# Patient Record
Sex: Female | Born: 1980 | Race: Black or African American | Hispanic: No | Marital: Single | Smoking: Former smoker
Health system: Southern US, Community
[De-identification: ages and names within clinical notes are randomized; demographics above are authoritative.]

## PROBLEM LIST (undated history)

## (undated) DIAGNOSIS — E669 Obesity, unspecified: Secondary | ICD-10-CM

## (undated) DIAGNOSIS — F99 Mental disorder, not otherwise specified: Secondary | ICD-10-CM

## (undated) DIAGNOSIS — N63 Unspecified lump in unspecified breast: Secondary | ICD-10-CM

## (undated) DIAGNOSIS — Z309 Encounter for contraceptive management, unspecified: Secondary | ICD-10-CM

## (undated) DIAGNOSIS — N390 Urinary tract infection, site not specified: Secondary | ICD-10-CM

## (undated) DIAGNOSIS — R87629 Unspecified abnormal cytological findings in specimens from vagina: Secondary | ICD-10-CM

## (undated) HISTORY — DX: Mental disorder, not otherwise specified: F99

## (undated) HISTORY — DX: Unspecified lump in unspecified breast: N63.0

## (undated) HISTORY — DX: Encounter for contraceptive management, unspecified: Z30.9

## (undated) HISTORY — DX: Urinary tract infection, site not specified: N39.0

## (undated) HISTORY — DX: Unspecified abnormal cytological findings in specimens from vagina: R87.629

## (undated) HISTORY — PX: CERVICAL BIOPSY: SHX590

## (undated) HISTORY — DX: Obesity, unspecified: E66.9

---
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
## 2002-12-28 ENCOUNTER — Other Ambulatory Visit: Admission: RE | Admit: 2002-12-28 | Discharge: 2002-12-28 | Payer: Self-pay | Admitting: Obstetrics and Gynecology

## 2004-04-26 ENCOUNTER — Other Ambulatory Visit: Admission: RE | Admit: 2004-04-26 | Discharge: 2004-04-26 | Payer: Self-pay | Admitting: Obstetrics and Gynecology

## 2008-09-23 ENCOUNTER — Other Ambulatory Visit: Admission: RE | Admit: 2008-09-23 | Discharge: 2008-09-23 | Payer: Self-pay | Admitting: Obstetrics and Gynecology

## 2009-04-17 ENCOUNTER — Ambulatory Visit (HOSPITAL_COMMUNITY): Admission: RE | Admit: 2009-04-17 | Discharge: 2009-04-17 | Payer: Self-pay | Admitting: Internal Medicine

## 2011-08-07 ENCOUNTER — Other Ambulatory Visit (HOSPITAL_COMMUNITY)
Admission: RE | Admit: 2011-08-07 | Discharge: 2011-08-07 | Disposition: A | Discharge status: Home / Self Care | Payer: Self-pay | Source: Ambulatory Visit | Attending: Obstetrics and Gynecology | Admitting: Obstetrics and Gynecology

## 2011-08-07 ENCOUNTER — Other Ambulatory Visit: Payer: Self-pay | Admitting: Adult Health

## 2011-08-07 DIAGNOSIS — Z01419 Encounter for gynecological examination (general) (routine) without abnormal findings: Secondary | ICD-10-CM | POA: Insufficient documentation

## 2011-08-07 DIAGNOSIS — Z113 Encounter for screening for infections with a predominantly sexual mode of transmission: Secondary | ICD-10-CM | POA: Insufficient documentation

## 2012-07-29 ENCOUNTER — Telehealth: Payer: Self-pay | Admitting: Adult Health

## 2012-07-29 ENCOUNTER — Telehealth: Payer: Self-pay | Admitting: *Deleted

## 2012-07-29 NOTE — Telephone Encounter (Signed)
Spoke with pt, she needs refills on her BC and Antidepressants. She has an appointment in May at our office. I advised the pt to call her pharmacy and get them to fax over a refill request. She verbalized understanding and stated she would do that.

## 2012-07-29 NOTE — Telephone Encounter (Signed)
I believe this was sent to me in error.  De Burrs Description: 32 year old female  07/29/2012 Telephone Provider: Adline Potter, NP  MRN: [id number] Department: [facility]            Reason for Call    Medication Management    Pt wants to talk to someone about birthcontrol and an anti-depressant           Call Documentation    No notes of this type exist for this encounter.         Encounter MyChart Messages    No messages in this encounter         Routing History    Priority Sent On From To Message Type    07/29/2012 2:50 PM Susie Cassette Barrett Tarri Fuller, CMA Patient Calls      Created by    Girard Cooter on 07/29/2012 02:49 PM

## 2012-07-30 ENCOUNTER — Telehealth: Payer: Self-pay | Admitting: Adult Health

## 2012-07-30 NOTE — Telephone Encounter (Signed)
Called patient, she just needs refills on sprintec and celexa. This was already taken care of by fax, she has appointment in May for physical.

## 2012-09-14 ENCOUNTER — Other Ambulatory Visit: Payer: Self-pay | Admitting: Adult Health

## 2012-10-15 ENCOUNTER — Encounter: Payer: Self-pay | Admitting: *Deleted

## 2012-10-16 ENCOUNTER — Ambulatory Visit (INDEPENDENT_AMBULATORY_CARE_PROVIDER_SITE_OTHER): Payer: BC Managed Care – PPO | Admitting: Adult Health

## 2012-10-16 ENCOUNTER — Other Ambulatory Visit (HOSPITAL_COMMUNITY)
Admission: RE | Admit: 2012-10-16 | Discharge: 2012-10-16 | Disposition: A | Discharge status: Home / Self Care | Payer: BC Managed Care – PPO | Source: Ambulatory Visit | Attending: Adult Health | Admitting: Adult Health

## 2012-10-16 ENCOUNTER — Encounter: Payer: Self-pay | Admitting: Adult Health

## 2012-10-16 VITALS — BP 116/80 | HR 76 | Ht 70.0 in | Wt 264.0 lb

## 2012-10-16 DIAGNOSIS — Z01419 Encounter for gynecological examination (general) (routine) without abnormal findings: Secondary | ICD-10-CM | POA: Insufficient documentation

## 2012-10-16 DIAGNOSIS — R4589 Other symptoms and signs involving emotional state: Secondary | ICD-10-CM

## 2012-10-16 DIAGNOSIS — Z309 Encounter for contraceptive management, unspecified: Secondary | ICD-10-CM

## 2012-10-16 DIAGNOSIS — F329 Major depressive disorder, single episode, unspecified: Secondary | ICD-10-CM | POA: Insufficient documentation

## 2012-10-16 DIAGNOSIS — Z113 Encounter for screening for infections with a predominantly sexual mode of transmission: Secondary | ICD-10-CM

## 2012-10-16 NOTE — Patient Instructions (Addendum)
Follow-up in 1 year for physical

## 2012-10-16 NOTE — Progress Notes (Signed)
Patient ID: Sue Nguyen, female   DOB: 1980/07/29, 32 y.o.   MRN: [id number] History of Present Illness: Sue Nguyen is a 32 year old black female in for a pap and physical. Desires STD testing.Happy with OCs and celexa.  Current Medications, Allergies, Past Medical History, Past Surgical History, Family History and Social History were reviewed in [employer] record.    Review of Systems: Patient denies any headaches, blurred vision, shortness of breath, chest pain, abdominal pain, problems with bowel movements, urination, or intercourse. No joint pains and she great with celexa, much less moody.  Physical Exam:BP 116/80  Pulse 76  Ht 5\' 10"  (1.778 m)  Wt 264 lb (119.75 kg)  BMI 37.88 kg/m2  LMP 09/21/2012 General:  Well developed, well nourished, no acute distress Skin:  Warm and dry Neck:  Midline trachea, normal thyroid Lungs; Clear to auscultation bilaterally Breast:  No dominant palpable mass, retraction, or nipple discharge Cardiovascular: Regular rate and rhythm Abdomen:  Soft, non tender, no hepatosplenomegaly Pelvic:  External genitalia is normal in appearance.  The vagina is normal in appearance.  The cervix is smooth with negative CMT.Pap performed with GC/CHL.  Uterus is felt to be normal size, shape, and contour.  No adnexal masses or tenderness noted. Extremities:  No swelling or varicosities noted Psych: alert and cooperative, seems to be in good mood, happy  Impression: Yearly gyn exam Contraceptive management History of moodiness STD screening  Plan: Physical in 1 year Check, CBC,CMP,TSH, lipids and HIV,RPR,HSV2, and Hept. B&C, can call next week or look on my chart for results She already has refills on celexa and sprintec

## 2012-10-17 LAB — COMPREHENSIVE METABOLIC PANEL
ALT: 15 U/L (ref 0–35)
AST: 21 U/L (ref 0–37)
Alkaline Phosphatase: 43 U/L (ref 39–117)
Chloride: 105 mEq/L (ref 96–112)
Creat: 0.83 mg/dL (ref 0.50–1.10)
Total Bilirubin: 0.4 mg/dL (ref 0.3–1.2)

## 2012-10-17 LAB — LIPID PANEL
HDL: 42 mg/dL (ref >39)
LDL Cholesterol: 41 mg/dL (ref 0–99)
Total CHOL/HDL Ratio: 2.6 Ratio
VLDL: 27 mg/dL (ref 0–40)

## 2012-10-17 LAB — CBC
MCH: 23.6 pg — ABNORMAL LOW (ref 26.0–34.0)
MCV: 74.6 fL — ABNORMAL LOW (ref 78.0–100.0)
Platelets: 261 10*3/uL (ref 150–400)
RDW: 15.3 % (ref 11.5–15.5)
WBC: 8.1 10*3/uL (ref 4.0–10.5)

## 2012-10-17 LAB — HIV ANTIBODY (ROUTINE TESTING W REFLEX): HIV: NONREACTIVE

## 2012-10-17 LAB — RPR

## 2012-10-19 LAB — HSV 2 ANTIBODY, IGG: HSV 2 Glycoprotein G Ab, IgG: <0.1 IV

## 2012-10-21 ENCOUNTER — Telehealth: Payer: Self-pay | Admitting: Adult Health

## 2012-10-21 NOTE — Telephone Encounter (Signed)
Pt informed of HGB of 10.8, encouraged to eat foods high in iron, iron supplement all other labs WNL per Cyril Mourning, NP

## 2013-03-18 ENCOUNTER — Other Ambulatory Visit: Payer: Self-pay

## 2013-04-12 HISTORY — PX: CHOLECYSTECTOMY: SHX55

## 2013-08-21 ENCOUNTER — Other Ambulatory Visit: Payer: Self-pay | Admitting: Adult Health

## 2013-08-23 ENCOUNTER — Telehealth: Payer: Self-pay | Admitting: *Deleted

## 2013-08-24 NOTE — Telephone Encounter (Signed)
Pt states refills done.

## 2013-09-25 ENCOUNTER — Other Ambulatory Visit: Payer: Self-pay | Admitting: Adult Health

## 2013-10-19 ENCOUNTER — Other Ambulatory Visit (HOSPITAL_COMMUNITY)
Admission: RE | Admit: 2013-10-19 | Discharge: 2013-10-19 | Disposition: A | Discharge status: Home / Self Care | Payer: BC Managed Care – PPO | Source: Ambulatory Visit | Attending: Adult Health | Admitting: Adult Health

## 2013-10-19 ENCOUNTER — Encounter: Payer: Self-pay | Admitting: Adult Health

## 2013-10-19 ENCOUNTER — Ambulatory Visit (INDEPENDENT_AMBULATORY_CARE_PROVIDER_SITE_OTHER): Payer: BC Managed Care – PPO | Admitting: Adult Health

## 2013-10-19 VITALS — BP 104/54 | HR 76 | Ht 70.0 in | Wt 280.0 lb

## 2013-10-19 DIAGNOSIS — Z1151 Encounter for screening for human papillomavirus (HPV): Secondary | ICD-10-CM | POA: Insufficient documentation

## 2013-10-19 DIAGNOSIS — Z01419 Encounter for gynecological examination (general) (routine) without abnormal findings: Secondary | ICD-10-CM

## 2013-10-19 DIAGNOSIS — N63 Unspecified lump in unspecified breast: Secondary | ICD-10-CM

## 2013-10-19 DIAGNOSIS — Z113 Encounter for screening for infections with a predominantly sexual mode of transmission: Secondary | ICD-10-CM

## 2013-10-19 DIAGNOSIS — Z309 Encounter for contraceptive management, unspecified: Secondary | ICD-10-CM

## 2013-10-19 DIAGNOSIS — R4589 Other symptoms and signs involving emotional state: Secondary | ICD-10-CM

## 2013-10-19 HISTORY — DX: Encounter for contraceptive management, unspecified: Z30.9

## 2013-10-19 HISTORY — DX: Unspecified lump in unspecified breast: N63.0

## 2013-10-19 MED ORDER — CITALOPRAM HYDROBROMIDE 20 MG PO TABS: ORAL_TABLET | ORAL | Status: DC

## 2013-10-19 MED ORDER — NORGESTIMATE-ETH ESTRADIOL 0.25-35 MG-MCG PO TABS: ORAL_TABLET | ORAL | Status: DC

## 2013-10-19 NOTE — Progress Notes (Signed)
Patient ID: Sue Nguyen, female   DOB: [birth year], 33 y.o.   MRN: [id number] History of Present Illness: Sue Nguyen is a 33 year old black female in for a pap and physical.And she wants STD testing.She is happy with celexa and sprintec.She had noticed a nodule in left breast.   Current Medications, Allergies, Past Medical History, Past Surgical History, Family History and Social History were reviewed in [employer] record.     Review of Systems: Patient denies any headaches, blurred vision, shortness of breath, chest pain, abdominal pain, problems with bowel movements, urination, or intercourse. No joint pain or mood swings.    Physical Exam:BP 104/54  Pulse 76  Ht 5\' 10"  (1.778 m)  Wt 280 lb (127.007 kg)  BMI 40.18 kg/m2  LMP 10/13/2013 General:  Well developed, well nourished, no acute distress Skin:  Warm and dry Neck:  Midline trachea, normal thyroid Lungs; Clear to auscultation bilaterally Breast:  No dominant palpable mass, retraction, or nipple discharge on right, on left no retraction or nipple discharge but has a pea sized nodule at 2 0;clock 6 fr from areola in crease between breast and underarm at stretch mark. Cardiovascular: Regular rate and rhythm Abdomen:  Soft, non tender, no hepatosplenomegaly Pelvic:  External genitalia is normal in appearance.  The vagina is normal in appearance.   The cervix is smooth, pap with HPV and GC/CHL performed.  Uterus is felt to be normal size, shape, and contour.  No    adnexal masses or tenderness noted. Extremities:  No swelling or varicosities noted Psych:  No mood changes, alert and cooperative,seems happy Discussed that breast nodule could be cyst or may be fibroglandular changes, but will get mammogram and [place] to be sure.  Impression: Yearly gyn exam  Left breast nodule Sue Nguyen Contraceptive management STD screening    Plan: Physical in 1 year Left diagnostic mammogram and left breast US 6/24 at 8:45 am at  [facility] Check HIV,RPR,HSV 2 Refilled sprintec ax 1 year Refilled celexa x 1 year Review handout on breast cyst

## 2013-10-19 NOTE — Patient Instructions (Signed)
Breast Cyst A breast cyst is a sac in the breast that is filled with fluid. Breast cysts are common in women. Women can have one or many cysts. When the breasts contain many cysts, it is usually due to a noncancerous (benign) condition called fibrocystic change. These lumps form under the influence of female hormones (estrogen and progesterone). The lumps are most often located in the upper, outer portion of the breast. They are often more swollen, painful, and tender before your period starts. They usually disappear after menopause, unless you are on hormone therapy.  There are several types of cysts:  Macrocyst. This is a cyst that is about 2 in. (5.1 cm) in diameter.   Microcyst. This is a tiny cyst that you cannot feel but can be seen with a mammogram or an ultrasound.   Galactocele. This is a cyst containing milk that may develop if you suddenly stop breastfeeding.   Sebaceous cyst of the skin. This type of cyst is not in the breast tissue itself. Breast cysts do not increase your risk of breast cancer. However, they must be monitored closely because they can be cancerous.  CAUSES  It is not known exactly what causes a breast cyst to form. Possible causes include:  An overgrowth of milk glands and connective tissue in the breast can block the milk glands, causing them to fill with fluid.   Scar tissue in the breast from previous surgery may block the glands, causing a cyst.  RISK FACTORS Estrogen may influence the development of a breast cyst.  SIGNS AND SYMPTOMS   Feeling a smooth, round, soft lump (like a grape) in the breast that is easily moveable.   Breast discomfort or pain.  Increase in size of the lump before your menstrual period and decrease in its size after your menstrual period.  DIAGNOSIS  A cyst can be felt during a physical exam by your health care provider. A breast X-ray exam (mammogram) and ultrasonography will be done to confirm the diagnosis. Fluid may  be removed from the cyst with a needle (fine needle aspiration) to make sure the cyst is not cancerous.  TREATMENT  Treatment may not be necessary. Your health care provider may monitor the cyst to see if it goes away on its own. If treatment is needed, it may include:  Hormone treatment.   Needle aspiration. There is a chance of the cyst coming back after aspiration.   Surgery to remove the whole cyst.  HOME CARE INSTRUCTIONS   Keep all follow-up appointments with your health care provider.  See your health care provider regularly:  Get a yearly exam by your health care provider.  Have a clinical breast exam by a health care provider every 1 3 years if you are 40 31 years of age. After age 60 years, you should have the exam every year.   Get mammogram tests as directed by your health care provider.   Understand the normal appearance and feel of your breasts and perform breast self-exams.   Only take over-the-counter or prescription medicines as directed by your health care provider.   Wear a supportive bra, especially when exercising.   Avoid caffeine.   Reduce your salt intake, especially before your menstrual period. Too much salt can cause fluid retention, breast swelling, and discomfort.  SEEK MEDICAL CARE IF:   You feel, or think you feel, a lump in your breast.   You notice that both breasts look or feel different than usual.  Your breast is still causing pain after your menstrual period is over.   You need medicine for breast pain and swelling that occurs with your menstrual period.  SEEK IMMEDIATE MEDICAL CARE IF:   You have severe pain, tenderness, redness, or warmth in your breast.   You have nipple discharge or bleeding.   Your breast lump becomes hard and painful.   You find new lumps or bumps that were not there before.   You feel lumps in your armpit (axilla).   You notice dimpling or wrinkling of the breast or nipple.   You  have a fever.  MAKE SURE YOU:  Understand these instructions.  Will watch your condition.  Will get help right away if you are not doing well or get worse. Document Released: 04/29/2005 Document Revised: 12/30/2012 Document Reviewed: 11/26/2012 [facility] Patient Information 2014 [place], [place]. Follow up by phone or my chart Physical in 1 year

## 2013-10-20 ENCOUNTER — Telehealth: Payer: Self-pay | Admitting: Adult Health

## 2013-10-20 LAB — HIV ANTIBODY (ROUTINE TESTING W REFLEX): HIV: NONREACTIVE

## 2013-10-20 LAB — RPR

## 2013-10-20 LAB — HSV 2 ANTIBODY, IGG: HSV 2 Glycoprotein G Ab, IgG: <0.1 IV

## 2013-10-20 NOTE — Telephone Encounter (Signed)
Pt aware labs negative.  

## 2013-10-21 LAB — CYTOLOGY - PAP

## 2013-11-03 ENCOUNTER — Encounter (HOSPITAL_COMMUNITY): Payer: BC Managed Care – PPO

## 2013-11-10 ENCOUNTER — Encounter (HOSPITAL_COMMUNITY): Payer: BC Managed Care – PPO

## 2013-11-16 ENCOUNTER — Ambulatory Visit (HOSPITAL_COMMUNITY)
Admission: RE | Admit: 2013-11-16 | Discharge: 2013-11-16 | Disposition: A | Discharge status: Home / Self Care | Payer: BC Managed Care – PPO | Source: Ambulatory Visit | Attending: Adult Health | Admitting: Adult Health

## 2013-11-16 DIAGNOSIS — N63 Unspecified lump in unspecified breast: Secondary | ICD-10-CM

## 2013-11-16 DIAGNOSIS — R928 Other abnormal and inconclusive findings on diagnostic imaging of breast: Secondary | ICD-10-CM | POA: Insufficient documentation

## 2014-03-14 ENCOUNTER — Encounter: Payer: Self-pay | Admitting: Adult Health

## 2014-07-04 ENCOUNTER — Telehealth: Payer: Self-pay | Admitting: Family Medicine

## 2014-07-04 NOTE — Telephone Encounter (Signed)
PCP IS [place].  SHE IS GOING THERE.

## 2014-07-05 ENCOUNTER — Ambulatory Visit (INDEPENDENT_AMBULATORY_CARE_PROVIDER_SITE_OTHER): Payer: BLUE CROSS/BLUE SHIELD | Admitting: Adult Health

## 2014-07-05 ENCOUNTER — Encounter: Payer: Self-pay | Admitting: Adult Health

## 2014-07-05 VITALS — BP 114/70 | HR 89 | Temp 98.2°F | Ht 69.5 in | Wt 269.5 lb

## 2014-07-05 DIAGNOSIS — R350 Frequency of micturition: Secondary | ICD-10-CM | POA: Insufficient documentation

## 2014-07-05 DIAGNOSIS — N390 Urinary tract infection, site not specified: Secondary | ICD-10-CM

## 2014-07-05 HISTORY — DX: Urinary tract infection, site not specified: N39.0

## 2014-07-05 LAB — POCT URINALYSIS DIPSTICK
GLUCOSE UA: NEGATIVE
Nitrite, UA: NEGATIVE
Protein, UA: NEGATIVE

## 2014-07-05 MED ORDER — SULFAMETHOXAZOLE-TRIMETHOPRIM 800-160 MG PO TABS: 1.0000 | ORAL_TABLET | Freq: Two times a day (BID) | ORAL | Status: DC

## 2014-07-05 NOTE — Progress Notes (Signed)
Subjective:     Patient ID: Sue Nguyen, female   DOB: [birth year], 34 y.o.   MRN: [id number]  HPI Sanah is a 34 year old black female in complaining of urinary frequency and pressure x 1 week,just got back from cruise.Had more sex and alcohol than usual.Has history of UTIs per pt.  Review of Systems +urinary frequency and pressure,some nausea, al other systems negative  Reviewed past medical,surgical, social and family history. Reviewed medications and allergies.     Objective:   Physical Exam BP 114/70 mmHg  Pulse 89  Temp(Src) 98.2 F (36.8 C)  Ht 5' 9.5" (1.765 m)  Wt 269 lb 8 oz (122.244 kg)  BMI 39.24 kg/m2  LMP 01/17/2016Urine 3+ blood and 2+ leuks, No CVAT. Skin warm and dry.Pelvic: external genitalia is normal in appearance no lesions, vagina: no discharge, no odor, good color and mositure,urethra has no lesions or masses noted, cervix:smooth, uterus: normal size, shape and contour, non tender, no masses felt, adnexa: no masses or tenderness noted. Bladder is non tender and no masses felt.    Assessment:     Urinary frequency with pressure UTI    Plan:    UA C&S sent Rx septra ds 1 bid x 7 days #14 Push fluids Review handout on UTI

## 2014-07-05 NOTE — Patient Instructions (Signed)
Urinary Tract Infection Urinary tract infections (UTIs) can develop anywhere along your urinary tract. Your urinary tract is your body's drainage system for removing wastes and extra water. Your urinary tract includes two kidneys, two ureters, a bladder, and a urethra. Your kidneys are a pair of bean-shaped organs. Each kidney is about the size of your fist. They are located below your ribs, one on each side of your spine. CAUSES Infections are caused by microbes, which are microscopic organisms, including fungi, viruses, and bacteria. These organisms are so small that they can only be seen through a microscope. Bacteria are the microbes that most commonly cause UTIs. SYMPTOMS  Symptoms of UTIs may vary by age and gender of the patient and by the location of the infection. Symptoms in young women typically include a frequent and intense urge to urinate and a painful, burning feeling in the bladder or urethra during urination. Older women and men are more likely to be tired, shaky, and weak and have muscle aches and abdominal pain. A fever may mean the infection is in your kidneys. Other symptoms of a kidney infection include pain in your back or sides below the ribs, nausea, and vomiting. DIAGNOSIS To diagnose a UTI, your caregiver will ask you about your symptoms. Your caregiver also will ask to provide a urine sample. The urine sample will be tested for bacteria and white blood cells. White blood cells are made by your body to help fight infection. TREATMENT  Typically, UTIs can be treated with medication. Because most UTIs are caused by a bacterial infection, they usually can be treated with the use of antibiotics. The choice of antibiotic and length of treatment depend on your symptoms and the type of bacteria causing your infection. HOME CARE INSTRUCTIONS  If you were prescribed antibiotics, take them exactly as your caregiver instructs you. Finish the medication even if you feel better after you  have only taken some of the medication.  Drink enough water and fluids to keep your urine clear or pale yellow.  Avoid caffeine, tea, and carbonated beverages. They tend to irritate your bladder.  Empty your bladder often. Avoid holding urine for long periods of time.  Empty your bladder before and after sexual intercourse.  After a bowel movement, women should cleanse from front to back. Use each tissue only once. SEEK MEDICAL CARE IF:   You have back pain.  You develop a fever.  Your symptoms do not begin to resolve within 3 days. SEEK IMMEDIATE MEDICAL CARE IF:   You have severe back pain or lower abdominal pain.  You develop chills.  You have nausea or vomiting.  You have continued burning or discomfort with urination. MAKE SURE YOU:   Understand these instructions.  Will watch your condition.  Will get help right away if you are not doing well or get worse. Document Released: 02/06/2005 Document Revised: 10/29/2011 Document Reviewed: 06/07/2011 [facility] Patient Information 2015 [place], [place]. This information is not intended to replace advice given to you by your health care provider. Make sure you discuss any questions you have with your health care provider. Push fluids Take septra ds

## 2014-07-06 LAB — URINALYSIS, ROUTINE W REFLEX MICROSCOPIC
BILIRUBIN UA: NEGATIVE
Glucose, UA: NEGATIVE
Ketones, UA: NEGATIVE
Nitrite, UA: NEGATIVE
PH UA: 6 (ref 5.0–7.5)
SPEC GRAV UA: 1.025 (ref 1.005–1.030)
Urobilinogen, Ur: 0.2 mg/dL (ref 0.2–1.0)

## 2014-07-06 LAB — MICROSCOPIC EXAMINATION: Casts: NONE SEEN /lpf

## 2014-07-07 ENCOUNTER — Telehealth: Payer: Self-pay | Admitting: Adult Health

## 2014-07-07 LAB — URINE CULTURE

## 2014-07-07 NOTE — Telephone Encounter (Signed)
Left message that did have UTI and that the septra ds should take care of it.

## 2014-10-19 ENCOUNTER — Telehealth: Payer: Self-pay | Admitting: Adult Health

## 2014-10-19 NOTE — Telephone Encounter (Signed)
Spoke with pt letting her know she can continue her Celexa per Knute Neu, CNM. Pt voiced understanding. [place]

## 2014-10-28 ENCOUNTER — Other Ambulatory Visit: Payer: Self-pay | Admitting: Obstetrics & Gynecology

## 2014-10-28 DIAGNOSIS — O3680X1 Pregnancy with inconclusive fetal viability, fetus 1: Secondary | ICD-10-CM

## 2014-10-31 ENCOUNTER — Ambulatory Visit (INDEPENDENT_AMBULATORY_CARE_PROVIDER_SITE_OTHER): Payer: Medicaid Other

## 2014-10-31 ENCOUNTER — Other Ambulatory Visit: Payer: BLUE CROSS/BLUE SHIELD

## 2014-10-31 DIAGNOSIS — O3680X1 Pregnancy with inconclusive fetal viability, fetus 1: Secondary | ICD-10-CM

## 2014-10-31 NOTE — Progress Notes (Signed)
[place] 9+3wks single IUP pos fht 171bpm,crl 24.6cm,rt ov 2.7 x 1.7 x 2.4cm simple corpus luteal cyst,normal lt ov

## 2014-11-01 ENCOUNTER — Other Ambulatory Visit: Payer: Self-pay | Admitting: Adult Health

## 2014-11-10 ENCOUNTER — Encounter: Payer: Self-pay | Admitting: Advanced Practice Midwife

## 2014-11-17 ENCOUNTER — Encounter: Payer: Self-pay | Admitting: Advanced Practice Midwife

## 2014-11-17 ENCOUNTER — Ambulatory Visit (INDEPENDENT_AMBULATORY_CARE_PROVIDER_SITE_OTHER): Payer: Medicaid Other | Admitting: Advanced Practice Midwife

## 2014-11-17 VITALS — BP 118/72 | HR 84 | Ht 73.0 in | Wt 273.5 lb

## 2014-11-17 DIAGNOSIS — Z1389 Encounter for screening for other disorder: Secondary | ICD-10-CM

## 2014-11-17 DIAGNOSIS — Z3490 Encounter for supervision of normal pregnancy, unspecified, unspecified trimester: Secondary | ICD-10-CM | POA: Insufficient documentation

## 2014-11-17 DIAGNOSIS — C700 Malignant neoplasm of cerebral meninges: Secondary | ICD-10-CM

## 2014-11-17 DIAGNOSIS — Z0283 Encounter for blood-alcohol and blood-drug test: Secondary | ICD-10-CM

## 2014-11-17 DIAGNOSIS — Z331 Pregnant state, incidental: Secondary | ICD-10-CM

## 2014-11-17 DIAGNOSIS — Z369 Encounter for antenatal screening, unspecified: Secondary | ICD-10-CM

## 2014-11-17 DIAGNOSIS — Z3491 Encounter for supervision of normal pregnancy, unspecified, first trimester: Secondary | ICD-10-CM

## 2014-11-17 LAB — POCT URINALYSIS DIPSTICK
Blood, UA: 3
GLUCOSE UA: NEGATIVE
Ketones, UA: NEGATIVE
Leukocytes, UA: NEGATIVE
NITRITE UA: NEGATIVE
Protein, UA: NEGATIVE

## 2014-11-17 MED ORDER — NITROFURANTOIN MONOHYD MACRO 100 MG PO CAPS: 100.0000 mg | ORAL_CAPSULE | Freq: Two times a day (BID) | ORAL | Status: AC

## 2014-11-17 NOTE — Progress Notes (Signed)
  Subjective:    Sue Nguyen is a G2P0010 [redacted]w[redacted]d being seen today for her first obstetrical visit.  Her obstetrical history is significant for obesity.  She has been walking 3.5 miles a day and has started eating clean, so she has lost weight. She also quit smoking the day she found out she was pregnant..  Pregnancy history fully reviewed.  Patient reports feeling as if she has a UTI:  Frequency, dysuria at end of voiding.    Filed Vitals:   11/17/14 1353 11/17/14 1418  BP: 118/72   Pulse: 84   Height:  6\' 1"  (1.854 m)  Weight: 273 lb 8 oz (124.059 kg)     HISTORY: OB History  Gravida Para Term Preterm AB SAB TAB Ectopic Multiple Living  2    1  1        # Outcome Date GA Lbr Len/2nd Weight Sex Delivery Anes PTL Lv  2 Current           1 TAB              Past Medical History  Diagnosis Date  . Mental disorder     moody  . Obesity   . Breast nodule 10/19/2013    Will get [place]  . Contraceptive management 10/19/2013  . Vaginal Pap smear, abnormal   . UTI (lower urinary tract infection) 07/05/2014   Past Surgical History  Procedure Laterality Date  . Cervical biopsy    . Cholecystectomy  04/2013   Family History  Problem Relation Age of Onset  . Alcohol abuse Other     mom's side of family  . Cancer Maternal Grandfather     lung  . Alcohol abuse Maternal Grandfather   . Alcohol abuse Maternal Grandmother     recovering alcoholic     Exam                                      System:     Skin: normal coloration and turgor, no rashes    Neurologic: oriented, normal, normal mood   Extremities: normal strength, tone, and muscle mass   HEENT PERRLA   Mouth/Teeth mucous membranes moist, normal dentition   Neck supple and no masses   Cardiovascular: regular rate and rhythm   Respiratory:  appears well, vitals normal, no respiratory distress, acyanotic   Abdomen: soft, non-tender;  FHR: 150 [place]          Assessment:    Pregnancy: G2P0010 Patient Active  Problem List   Diagnosis Date Noted  . Supervision of normal pregnancy 11/17/2014  . Breast nodule 10/19/2013  . Moody 10/16/2012        Plan:     Initial labs drawn. Continue prenatal vitamins;  May add Oxly Treat for UTI: rx Macrobid 100mg  BID X7 Problem list reviewed and updated  Reviewed recommended weight gain based on pre-gravid BMI  Encouraged well-balanced diet Genetic Screening discussed Integrated Screen: declined.  Ultrasound discussed; fetal survey: requested.  Follow up in 4 weeks for LROB.  CRESENZO-DISHMAN,Lira Stephen 11/17/2014

## 2014-11-17 NOTE — Patient Instructions (Signed)
Safe Medications in Pregnancy  ° °Acne: °Benzoyl Peroxide °Salicylic Acid ° °Backache/Headache: °Tylenol: 2 regular strength every 4 hours OR °             2 Extra strength every 6 hours ° °Colds/Coughs/Allergies: °Benadryl (alcohol free) 25 mg every 6 hours as needed °Breath right strips °Claritin °Cepacol throat lozenges °Chloraseptic throat spray °Cold-Eeze- up to three times per day °Cough drops, alcohol free °Flonase (by prescription only) °Guaifenesin °Mucinex °Robitussin DM (plain only, alcohol free) °Saline nasal spray/drops °Sudafed (pseudoephedrine) & Actifed ** use only after [redacted] weeks gestation and if you do not have high blood pressure °Tylenol °Vicks Vaporub °Zinc lozenges °Zyrtec  ° °Constipation: °Colace °Ducolax suppositories °Fleet enema °Glycerin suppositories °Metamucil °Milk of magnesia °Miralax °Senokot °Smooth move tea ° °Diarrhea: °Kaopectate °Imodium A-D ° °*NO pepto Bismol ° °Hemorrhoids: °Anusol °Anusol HC °Preparation H °Tucks ° °Indigestion: °Tums °Maalox °Mylanta °Zantac  °Pepcid ° °Insomnia: °Benadryl (alcohol free) 25mg every 6 hours as needed °Tylenol PM °Unisom, no Gelcaps ° °Leg Cramps: °Tums °MagGel ° °Nausea/Vomiting:  °Bonine °Dramamine °Emetrol °Ginger extract °Sea bands °Meclizine  °Nausea medication to take during pregnancy:  °Unisom (doxylamine succinate 25 mg tablets) Take one tablet daily at bedtime. If symptoms are not adequately controlled, the dose can be increased to a maximum recommended dose of two tablets daily (1/2 tablet in the morning, 1/2 tablet mid-afternoon and one at bedtime). °Vitamin B6 100mg tablets. Take one tablet twice a day (up to 200 mg per day). ° °Skin Rashes: °Aveeno products °Benadryl cream or 25mg every 6 hours as needed °Calamine Lotion °1% cortisone cream ° °Yeast infection: °Gyne-lotrimin 7 °Monistat 7 ° ° °**If taking multiple medications, please check labels to avoid duplicating the same active ingredients °**take medication as directed on  the label °** Do not exceed 4000 mg of tylenol in 24 hours °**Do not take medications that contain aspirin or ibuprofen ° ° ° °First Trimester of Pregnancy °The first trimester of pregnancy is from week 1 until the end of week 12 (months 1 through 3). A week after a sperm fertilizes an egg, the egg will implant on the wall of the uterus. This embryo will begin to develop into a baby. Genes from you and your partner are forming the baby. The female genes determine whether the baby is a boy or a girl. At 6-8 weeks, the eyes and face are formed, and the heartbeat can be seen on ultrasound. At the end of 12 weeks, all the baby's organs are formed.  °Now that you are pregnant, you will want to do everything you can to have a healthy baby. Two of the most important things are to get good prenatal care and to follow your health care provider's instructions. Prenatal care is all the medical care you receive before the baby's birth. This care will help prevent, find, and treat any problems during the pregnancy and childbirth. °BODY CHANGES °Your body goes through many changes during pregnancy. The changes vary from woman to woman.  °· You may gain or lose a couple of pounds at first. °· You may feel sick to your stomach (nauseous) and throw up (vomit). If the vomiting is uncontrollable, call your health care provider. °· You may tire easily. °· You may develop headaches that can be relieved by medicines approved by your health care provider. °· You may urinate more often. Painful urination may mean you have a bladder infection. °· You may develop heartburn as a result of your   pregnancy. °· You may develop constipation because certain hormones are causing the muscles that push waste through your intestines to slow down. °· You may develop hemorrhoids or swollen, bulging veins (varicose veins). °· Your breasts may begin to grow larger and become tender. Your nipples may stick out more, and the tissue that surrounds them (areola)  may become darker. °· Your gums may bleed and may be sensitive to brushing and flossing. °· Dark spots or blotches (chloasma, mask of pregnancy) may develop on your face. This will likely fade after the baby is born. °· Your menstrual periods will stop. °· You may have a loss of appetite. °· You may develop cravings for certain kinds of food. °· You may have changes in your emotions from day to day, such as being excited to be pregnant or being concerned that something may go wrong with the pregnancy and baby. °· You may have more vivid and strange dreams. °· You may have changes in your hair. These can include thickening of your hair, rapid growth, and changes in texture. Some women also have hair loss during or after pregnancy, or hair that feels dry or thin. Your hair will most likely return to normal after your baby is born. °WHAT TO EXPECT AT YOUR PRENATAL VISITS °During a routine prenatal visit: °· You will be weighed to make sure you and the baby are growing normally. °· Your blood pressure will be taken. °· Your abdomen will be measured to track your baby's growth. °· The fetal heartbeat will be listened to starting around week 10 or 12 of your pregnancy. °· Test results from any previous visits will be discussed. °Your health care provider may ask you: °· How you are feeling. °· If you are feeling the baby move. °· If you have had any abnormal symptoms, such as leaking fluid, bleeding, severe headaches, or abdominal cramping. °· If you have any questions. °Other tests that may be performed during your first trimester include: °· Blood tests to find your blood type and to check for the presence of any previous infections. They will also be used to check for low iron levels (anemia) and Rh antibodies. Later in the pregnancy, blood tests for diabetes will be done along with other tests if problems develop. °· Urine tests to check for infections, diabetes, or protein in the urine. °· An ultrasound to confirm  the proper growth and development of the baby. °· An amniocentesis to check for possible genetic problems. °· Fetal screens for spina bifida and Down syndrome. °· You may need other tests to make sure you and the baby are doing well. °HOME CARE INSTRUCTIONS  °Medicines °· Follow your health care provider's instructions regarding medicine use. Specific medicines may be either safe or unsafe to take during pregnancy. °· Take your prenatal vitamins as directed. °· If you develop constipation, try taking a stool softener if your health care provider approves. °Diet °· Eat regular, well-balanced meals. Choose a variety of foods, such as meat or vegetable-based protein, fish, milk and low-fat dairy products, vegetables, fruits, and whole grain breads and cereals. Your health care provider will help you determine the amount of weight gain that is right for you. °· Avoid raw meat and uncooked cheese. These carry germs that can cause birth defects in the baby. °· Eating four or five small meals rather than three large meals a day may help relieve nausea and vomiting. If you start to feel nauseous, eating a few soda crackers   can be helpful. Drinking liquids between meals instead of during meals also seems to help nausea and vomiting. °· If you develop constipation, eat more high-fiber foods, such as fresh vegetables or fruit and whole grains. Drink enough fluids to keep your urine clear or pale yellow. °Activity and Exercise °· Exercise only as directed by your health care provider. Exercising will help you: °¨ Control your weight. °¨ Stay in shape. °¨ Be prepared for labor and delivery. °· Experiencing pain or cramping in the lower abdomen or low back is a good sign that you should stop exercising. Check with your health care provider before continuing normal exercises. °· Try to avoid standing for long periods of time. Move your legs often if you must stand in one place for a long time. °· Avoid heavy lifting. °· Wear  low-heeled shoes, and practice good posture. °· You may continue to have sex unless your health care provider directs you otherwise. °Relief of Pain or Discomfort °· Wear a good support bra for breast tenderness.   °· Take warm sitz baths to soothe any pain or discomfort caused by hemorrhoids. Use hemorrhoid cream if your health care provider approves.   °· Rest with your legs elevated if you have leg cramps or low back pain. °· If you develop varicose veins in your legs, wear support hose. Elevate your feet for 15 minutes, 3-4 times a day. Limit salt in your diet. °Prenatal Care °· Schedule your prenatal visits by the twelfth week of pregnancy. They are usually scheduled monthly at first, then more often in the last 2 months before delivery. °· Write down your questions. Take them to your prenatal visits. °· Keep all your prenatal visits as directed by your health care provider. °Safety °· Wear your seat belt at all times when driving. °· Make a list of emergency phone numbers, including numbers for family, friends, the hospital, and police and fire departments. °General Tips °· Ask your health care provider for a referral to a local prenatal education class. Begin classes no later than at the beginning of month 6 of your pregnancy. °· Ask for help if you have counseling or nutritional needs during pregnancy. Your health care provider can offer advice or refer you to specialists for help with various needs. °· Do not use hot tubs, steam rooms, or saunas. °· Do not douche or use tampons or scented sanitary pads. °· Do not cross your legs for long periods of time. °· Avoid cat litter boxes and soil used by cats. These carry germs that can cause birth defects in the baby and possibly loss of the fetus by miscarriage or stillbirth. °· Avoid all smoking, herbs, alcohol, and medicines not prescribed by your health care provider. Chemicals in these affect the formation and growth of the baby. °· Schedule a dentist  appointment. At home, brush your teeth with a soft toothbrush and be gentle when you floss. °SEEK MEDICAL CARE IF:  °· You have dizziness. °· You have mild pelvic cramps, pelvic pressure, or nagging pain in the abdominal area. °· You have persistent nausea, vomiting, or diarrhea. °· You have a bad smelling vaginal discharge. °· You have pain with urination. °· You notice increased swelling in your face, hands, legs, or ankles. °SEEK IMMEDIATE MEDICAL CARE IF:  °· You have a fever. °· You are leaking fluid from your vagina. °· You have spotting or bleeding from your vagina. °· You have severe abdominal cramping or pain. °· You have rapid weight gain   or loss. °· You vomit blood or material that looks like coffee grounds. °· You are exposed to German measles and have never had them. °· You are exposed to fifth disease or chickenpox. °· You develop a severe headache. °· You have shortness of breath. °· You have any kind of trauma, such as from a fall or a car accident. °Document Released: 04/23/2001 Document Revised: 09/13/2013 Document Reviewed: 03/09/2013 °Exit[employer]® Patient Information ©2015 [employer]. This information is not intended to replace advice given to you by your health care provider. Make sure you discuss any questions you have with your health care provider. ° °

## 2014-11-18 LAB — CBC
HEMATOCRIT: 34.3 % (ref 34.0–46.6)
Hemoglobin: 10.8 g/dL — ABNORMAL LOW (ref 11.1–15.9)
MCH: 23.8 pg — AB (ref 26.6–33.0)
MCHC: 31.5 g/dL (ref 31.5–35.7)
MCV: 76 fL — ABNORMAL LOW (ref 79–97)
Platelets: 256 10*3/uL (ref 150–379)
RBC: 4.54 x10E6/uL (ref 3.77–5.28)
RDW: 14.4 % (ref 12.3–15.4)
WBC: 9.9 10*3/uL (ref 3.4–10.8)

## 2014-11-18 LAB — ABO/RH: Rh Factor: POSITIVE

## 2014-11-18 LAB — MICROSCOPIC EXAMINATION: Casts: NONE SEEN /lpf

## 2014-11-18 LAB — PMP SCREEN PROFILE (10S), URINE
Amphetamine Screen, Ur: NEGATIVE ng/mL
Barbiturate Screen, Ur: NEGATIVE ng/mL
Benzodiazepine Screen, Urine: NEGATIVE ng/mL
Cannabinoids Ur Ql Scn: NEGATIVE ng/mL
Cocaine(Metab.)Screen, Urine: NEGATIVE ng/mL
Creatinine(Crt), U: 113.5 mg/dL (ref 20.0–300.0)
Methadone Scn, Ur: NEGATIVE ng/mL
OXYCODONE+OXYMORPHONE UR QL SCN: NEGATIVE ng/mL
Opiate Scrn, Ur: NEGATIVE ng/mL
PCP Scrn, Ur: NEGATIVE ng/mL
PH UR, DRUG SCRN: 5.9 (ref 4.5–8.9)
Propoxyphene, Screen: NEGATIVE ng/mL

## 2014-11-18 LAB — URINALYSIS, ROUTINE W REFLEX MICROSCOPIC
Bilirubin, UA: NEGATIVE
GLUCOSE, UA: NEGATIVE
Ketones, UA: NEGATIVE
Nitrite, UA: NEGATIVE
Protein, UA: NEGATIVE
Specific Gravity, UA: 1.017 (ref 1.005–1.030)
UUROB: 0.2 mg/dL (ref 0.2–1.0)
pH, UA: 6.5 (ref 5.0–7.5)

## 2014-11-18 LAB — RPR: RPR Ser Ql: NONREACTIVE

## 2014-11-18 LAB — RUBELLA SCREEN: Rubella Antibodies, IGG: 2.93 index (ref >0.99)

## 2014-11-18 LAB — GC/CHLAMYDIA PROBE AMP
Chlamydia trachomatis, NAA: NEGATIVE
Neisseria gonorrhoeae by PCR: NEGATIVE

## 2014-11-18 LAB — HIV ANTIBODY (ROUTINE TESTING W REFLEX): HIV Screen 4th Generation wRfx: NONREACTIVE

## 2014-11-18 LAB — SICKLE CELL SCREEN: Sickle Cell Screen: NEGATIVE

## 2014-11-18 LAB — URINE CULTURE

## 2014-11-18 LAB — ANTIBODY SCREEN: Antibody Screen: NEGATIVE

## 2014-11-18 LAB — VARICELLA ZOSTER ANTIBODY, IGG: Varicella zoster IgG: 2062 index (ref >165)

## 2014-11-18 LAB — HEPATITIS B SURFACE ANTIGEN: Hepatitis B Surface Ag: NEGATIVE

## 2014-12-14 ENCOUNTER — Ambulatory Visit (INDEPENDENT_AMBULATORY_CARE_PROVIDER_SITE_OTHER): Payer: Medicaid Other | Admitting: Women's Health

## 2014-12-14 ENCOUNTER — Encounter: Payer: Self-pay | Admitting: Women's Health

## 2014-12-14 VITALS — BP 118/62 | HR 76 | Wt 277.0 lb

## 2014-12-14 DIAGNOSIS — Z363 Encounter for antenatal screening for malformations: Secondary | ICD-10-CM

## 2014-12-14 DIAGNOSIS — Z3492 Encounter for supervision of normal pregnancy, unspecified, second trimester: Secondary | ICD-10-CM

## 2014-12-14 DIAGNOSIS — Z331 Pregnant state, incidental: Secondary | ICD-10-CM

## 2014-12-14 DIAGNOSIS — Z1389 Encounter for screening for other disorder: Secondary | ICD-10-CM

## 2014-12-14 LAB — POCT URINALYSIS DIPSTICK
GLUCOSE UA: NEGATIVE
KETONES UA: NEGATIVE
Leukocytes, UA: NEGATIVE
NITRITE UA: NEGATIVE
PROTEIN UA: NEGATIVE

## 2014-12-14 NOTE — Patient Instructions (Signed)
Second Trimester of Pregnancy The second trimester is from week 13 through week 28, months 4 through 6. The second trimester is often a time when you feel your best. Your body has also adjusted to being pregnant, and you begin to feel better physically. Usually, morning sickness has lessened or quit completely, you may have more energy, and you may have an increase in appetite. The second trimester is also a time when the fetus is growing rapidly. At the end of the sixth month, the fetus is about 9 inches long and weighs about 1 pounds. You will likely begin to feel the baby move (quickening) between 18 and 20 weeks of the pregnancy. BODY CHANGES Your body goes through many changes during pregnancy. The changes vary from woman to woman.   Your weight will continue to increase. You will notice your lower abdomen bulging out.  You may begin to get stretch marks on your hips, abdomen, and breasts.  You may develop headaches that can be relieved by medicines approved by your health care provider.  You may urinate more often because the fetus is pressing on your bladder.  You may develop or continue to have heartburn as a result of your pregnancy.  You may develop constipation because certain hormones are causing the muscles that push waste through your intestines to slow down.  You may develop hemorrhoids or swollen, bulging veins (varicose veins).  You may have back pain because of the weight gain and pregnancy hormones relaxing your joints between the bones in your pelvis and as a result of a shift in weight and the muscles that support your balance.  Your breasts will continue to grow and be tender.  Your gums may bleed and may be sensitive to brushing and flossing.  Dark spots or blotches (chloasma, mask of pregnancy) may develop on your face. This will likely fade after the baby is born.  A dark line from your belly button to the pubic area (linea nigra) may appear. This will likely fade  after the baby is born.  You may have changes in your hair. These can include thickening of your hair, rapid growth, and changes in texture. Some women also have hair loss during or after pregnancy, or hair that feels dry or thin. Your hair will most likely return to normal after your baby is born. WHAT TO EXPECT AT YOUR PRENATAL VISITS During a routine prenatal visit:  You will be weighed to make sure you and the fetus are growing normally.  Your blood pressure will be taken.  Your abdomen will be measured to track your baby's growth.  The fetal heartbeat will be listened to.  Any test results from the previous visit will be discussed. Your health care provider may ask you:  How you are feeling.  If you are feeling the baby move.  If you have had any abnormal symptoms, such as leaking fluid, bleeding, severe headaches, or abdominal cramping.  If you have any questions. Other tests that may be performed during your second trimester include:  Blood tests that check for:  Low iron levels (anemia).  Gestational diabetes (between 24 and 28 weeks).  Rh antibodies.  Urine tests to check for infections, diabetes, or protein in the urine.  An ultrasound to confirm the proper growth and development of the baby.  An amniocentesis to check for possible genetic problems.  Fetal screens for spina bifida and Down syndrome. HOME CARE INSTRUCTIONS   Avoid all smoking, herbs, alcohol, and unprescribed   drugs. These chemicals affect the formation and growth of the baby.  Follow your health care provider's instructions regarding medicine use. There are medicines that are either safe or unsafe to take during pregnancy.  Exercise only as directed by your health care provider. Experiencing uterine cramps is a good sign to stop exercising.  Continue to eat regular, healthy meals.  Wear a good support bra for breast tenderness.  Do not use hot tubs, steam rooms, or saunas.  Wear your  seat belt at all times when driving.  Avoid raw meat, uncooked cheese, cat litter boxes, and soil used by cats. These carry germs that can cause birth defects in the baby.  Take your prenatal vitamins.  Try taking a stool softener (if your health care provider approves) if you develop constipation. Eat more high-fiber foods, such as fresh vegetables or fruit and whole grains. Drink plenty of fluids to keep your urine clear or pale yellow.  Take warm sitz baths to soothe any pain or discomfort caused by hemorrhoids. Use hemorrhoid cream if your health care provider approves.  If you develop varicose veins, wear support hose. Elevate your feet for 15 minutes, 3-4 times a day. Limit salt in your diet.  Avoid heavy lifting, wear low heel shoes, and practice good posture.  Rest with your legs elevated if you have leg cramps or low back pain.  Visit your dentist if you have not gone yet during your pregnancy. Use a soft toothbrush to brush your teeth and be gentle when you floss.  A sexual relationship may be continued unless your health care provider directs you otherwise.  Continue to go to all your prenatal visits as directed by your health care provider. SEEK MEDICAL CARE IF:   You have dizziness.  You have mild pelvic cramps, pelvic pressure, or nagging pain in the abdominal area.  You have persistent nausea, vomiting, or diarrhea.  You have a bad smelling vaginal discharge.  You have pain with urination. SEEK IMMEDIATE MEDICAL CARE IF:   You have a fever.  You are leaking fluid from your vagina.  You have spotting or bleeding from your vagina.  You have severe abdominal cramping or pain.  You have rapid weight gain or loss.  You have shortness of breath with chest pain.  You notice sudden or extreme swelling of your face, hands, ankles, feet, or legs.  You have not felt your baby move in over an hour.  You have severe headaches that do not go away with  medicine.  You have vision changes. Document Released: 04/23/2001 Document Revised: 05/04/2013 Document Reviewed: 06/30/2012 Exit[employer] Patient Information 2015 [employer]. This information is not intended to replace advice given to you by your health care provider. Make sure you discuss any questions you have with your health care provider.  

## 2014-12-14 NOTE — Progress Notes (Signed)
Low-risk OB appointment G2P0010 [redacted]w[redacted]d Estimated Date of Delivery: 06/02/15 BP 118/62 mmHg  Pulse 76  Wt 277 lb (125.646 kg)  LMP 08/26/2014 (Exact Date)  BP, weight, and urine reviewed.  Refer to obstetrical flow sheet for FH & FHR.  No fm yet. Denies cramping, lof, vb, or uti s/s. No complaints. Walking 3+miles daily, family involved and very supportive.  Reviewed warning s/s to report. Plan:  Continue routine obstetrical care  F/U in 4wks for OB appointment and anatomy u/s Declined genetic screening

## 2014-12-15 ENCOUNTER — Encounter: Payer: Medicaid Other | Admitting: Advanced Practice Midwife

## 2015-01-11 ENCOUNTER — Ambulatory Visit (INDEPENDENT_AMBULATORY_CARE_PROVIDER_SITE_OTHER): Payer: Medicaid Other

## 2015-01-11 ENCOUNTER — Ambulatory Visit (INDEPENDENT_AMBULATORY_CARE_PROVIDER_SITE_OTHER): Payer: Medicaid Other | Admitting: Advanced Practice Midwife

## 2015-01-11 VITALS — BP 98/60 | HR 84 | Wt 282.0 lb

## 2015-01-11 DIAGNOSIS — Z36 Encounter for antenatal screening of mother: Secondary | ICD-10-CM

## 2015-01-11 DIAGNOSIS — Z1389 Encounter for screening for other disorder: Secondary | ICD-10-CM

## 2015-01-11 DIAGNOSIS — Z331 Pregnant state, incidental: Secondary | ICD-10-CM

## 2015-01-11 DIAGNOSIS — Z3492 Encounter for supervision of normal pregnancy, unspecified, second trimester: Secondary | ICD-10-CM

## 2015-01-11 DIAGNOSIS — Z363 Encounter for antenatal screening for malformations: Secondary | ICD-10-CM

## 2015-01-11 LAB — POCT URINALYSIS DIPSTICK
Blood, UA: 3
Glucose, UA: NEGATIVE
KETONES UA: NEGATIVE
LEUKOCYTES UA: NEGATIVE
Nitrite, UA: NEGATIVE
Protein, UA: NEGATIVE

## 2015-01-11 NOTE — Progress Notes (Signed)
G2P0010 [redacted]w[redacted]d Estimated Date of Delivery: 06/02/15  Blood pressure 98/60, pulse 84, weight 282 lb (127.914 kg), last menstrual period 08/26/2014.   BP weight and urine results all reviewed and noted.  Please refer to the obstetrical flow sheet for the fundal height and fetal heart rate documentation: anatomy scan today:  [place] 19+5wks,measurements c/w dates,efw 351g,svp of fluid 5.8cm,fht 563OVF,IE 3.9cm,cephalic,bilat adnexa's wnl,limited view of heart and face,please have pt come back for additional images,no obvious abn seen  Patient reports good fetal movement, denies any bleeding and no rupture of membranes symptoms or regular contractions. Patient is without complaints. All questions were answered.    Orders Placed This Encounter  Procedures  . POCT urinalysis dipstick    Plan:  Continued routine obstetrical care,   Return in about 4 weeks (around 02/08/2015) for LROB.

## 2015-01-11 NOTE — Progress Notes (Signed)
[place] 19+5wks,measurements c/w dates,efw 351g,svp of fluid 5.8cm,fht 151VOH,YW 3.9cm,cephalic,bilat adnexa's wnl,limited view of heart and face,please have pt come back for additional images,no obvious abn seen

## 2015-01-20 ENCOUNTER — Telehealth: Payer: Self-pay | Admitting: Obstetrics & Gynecology

## 2015-01-20 DIAGNOSIS — R3915 Urgency of urination: Secondary | ICD-10-CM

## 2015-01-20 NOTE — Telephone Encounter (Signed)
Pt c/o urinary urgency with little output and pressure. Informed pt order for UA and CX per Derrek Monaco, NP, push lots of water and cranberry juice. Pt states will go to [place] now.

## 2015-01-21 LAB — URINALYSIS, ROUTINE W REFLEX MICROSCOPIC
Bilirubin, UA: NEGATIVE
Glucose, UA: NEGATIVE
Ketones, UA: NEGATIVE
Leukocytes, UA: NEGATIVE
Nitrite, UA: NEGATIVE
PH UA: 6.5 (ref 5.0–7.5)
Protein, UA: NEGATIVE
Specific Gravity, UA: 1.021 (ref 1.005–1.030)
UUROB: 0.2 mg/dL (ref 0.2–1.0)

## 2015-01-21 LAB — MICROSCOPIC EXAMINATION
CASTS: NONE SEEN /LPF
Epithelial Cells (non renal): >10 /hpf — AB (ref 0–10)

## 2015-01-23 ENCOUNTER — Telehealth: Payer: Self-pay | Admitting: *Deleted

## 2015-01-23 ENCOUNTER — Encounter: Payer: Self-pay | Admitting: Obstetrics and Gynecology

## 2015-01-23 ENCOUNTER — Telehealth: Payer: Self-pay | Admitting: Adult Health

## 2015-01-23 ENCOUNTER — Ambulatory Visit (INDEPENDENT_AMBULATORY_CARE_PROVIDER_SITE_OTHER): Payer: Medicaid Other | Admitting: Obstetrics and Gynecology

## 2015-01-23 VITALS — BP 118/60 | HR 80 | Wt 280.0 lb

## 2015-01-23 DIAGNOSIS — Z331 Pregnant state, incidental: Secondary | ICD-10-CM

## 2015-01-23 DIAGNOSIS — Z1389 Encounter for screening for other disorder: Secondary | ICD-10-CM

## 2015-01-23 DIAGNOSIS — Z3492 Encounter for supervision of normal pregnancy, unspecified, second trimester: Secondary | ICD-10-CM

## 2015-01-23 LAB — POCT URINALYSIS DIPSTICK
GLUCOSE UA: NEGATIVE
Ketones, UA: NEGATIVE
LEUKOCYTES UA: NEGATIVE
NITRITE UA: NEGATIVE
Protein, UA: NEGATIVE
RBC UA: 2

## 2015-01-23 NOTE — Telephone Encounter (Signed)
Mom called Sue Nguyen has fever to come in at 4 pm to see Dr Glo Herring

## 2015-01-23 NOTE — Progress Notes (Signed)
Patient ID: Sue Nguyen, female   DOB: 09/05/1980, 34 y.o.   MRN: [id number]  Work-in for urinary symptoms.  G2P0010 [redacted]w[redacted]d Estimated Date of Delivery: 06/02/15  Blood pressure 118/60, pulse 80, weight 280 lb (127.007 kg), last menstrual period 08/26/2014.   refer to the ob flow sheet for FH and FHR, also BP, Wt, Urine results:notable for negative protein, nitrates, leukocytes.  Patient reports  + good fetal movement, denies any bleeding and no rupture of membranes symptoms or regular contractions.  Patient complaints: Pt reports suprapubic pain that started a few days ago. She states fever x 1,, nausea, rare urinary frequency and generalized body aches as associated symptoms. Pt had Urine culture 3 days ago, which has not resulted. Pt denies back pain, itching or irritation at the vulva.  FHR-152 bpm FH- U-0, 20  Questions were answered. Assessment: [redacted]w[redacted]d, G2P0010 symptoms not diagnostic of uti.  Plan:  Continued routine obstetrical care. Will call within 36 hours for culture.  Pt to call us if not resulted by 9/14  F/u in 2 weeks for regularly schedule, routine care.   This chart was scribed for Jonnie Kind, MD by Tula Nakayama, Medical Scribe. This patient was seen in room 1 and the patient's care was started at 4:56 PM.   I personally performed the services described in this documentation, which was SCRIBED in my presence. The recorded information has been reviewed and considered accurate. It has been edited as necessary during review. Jonnie Kind, MD

## 2015-01-23 NOTE — Progress Notes (Signed)
Pt states that she had a fever this past Saturday and she feels bad. Pt states that she thinks that she has an UTI.

## 2015-01-23 NOTE — Telephone Encounter (Signed)
Left message urine culture not back but had blood in urine,if has flu like symptoms, may need to be seen,call back

## 2015-01-25 ENCOUNTER — Telehealth: Payer: Self-pay | Admitting: *Deleted

## 2015-01-25 DIAGNOSIS — Z3492 Encounter for supervision of normal pregnancy, unspecified, second trimester: Secondary | ICD-10-CM

## 2015-01-25 NOTE — Telephone Encounter (Signed)
Krystyna called back and will drop off urine tomorrow.

## 2015-01-26 ENCOUNTER — Other Ambulatory Visit: Payer: Medicaid Other

## 2015-01-26 DIAGNOSIS — N390 Urinary tract infection, site not specified: Secondary | ICD-10-CM

## 2015-01-28 LAB — URINE CULTURE

## 2015-02-08 ENCOUNTER — Encounter: Payer: Self-pay | Admitting: Women's Health

## 2015-02-08 ENCOUNTER — Ambulatory Visit (INDEPENDENT_AMBULATORY_CARE_PROVIDER_SITE_OTHER): Payer: Medicaid Other | Admitting: Women's Health

## 2015-02-08 VITALS — BP 112/64 | HR 84 | Wt 277.0 lb

## 2015-02-08 DIAGNOSIS — Z331 Pregnant state, incidental: Secondary | ICD-10-CM

## 2015-02-08 DIAGNOSIS — Z1389 Encounter for screening for other disorder: Secondary | ICD-10-CM

## 2015-02-08 DIAGNOSIS — Z363 Encounter for antenatal screening for malformations: Secondary | ICD-10-CM

## 2015-02-08 DIAGNOSIS — Z3492 Encounter for supervision of normal pregnancy, unspecified, second trimester: Secondary | ICD-10-CM

## 2015-02-08 LAB — POCT URINALYSIS DIPSTICK
GLUCOSE UA: NEGATIVE
Ketones, UA: NEGATIVE
Leukocytes, UA: NEGATIVE
NITRITE UA: NEGATIVE

## 2015-02-08 NOTE — Progress Notes (Signed)
Low-risk OB appointment G2P0010 [redacted]w[redacted]d Estimated Date of Delivery: 06/02/15 BP 112/64 mmHg  Pulse 84  Wt 277 lb (125.646 kg)  LMP 08/26/2014 (Exact Date)  BP, weight, and urine reviewed.  Refer to obstetrical flow sheet for FH & FHR.  Reports good fm.  Denies regular uc's, lof, vb, or uti s/s. No complaints. Reviewed ptl s/s, fm. Plan:  Continue routine obstetrical care  F/U in 4wks for OB appointment, pn2, and f/u anatomy u/s

## 2015-02-08 NOTE — Patient Instructions (Addendum)
You will have your sugar test next visit.  Please do not eat or drink anything after midnight the night before you come, not even water.  You will be here for at least two hours.     [email or web address] (exercises/stretches)  Call the office [phone]) or go to [facility] if:  You begin to have strong, frequent contractions  Your water breaks.  Sometimes it is a big gush of fluid, sometimes it is just a trickle that keeps getting your panties wet or running down your legs  You have vaginal bleeding.  It is normal to have a small amount of spotting if your cervix was checked.   You don't feel your baby moving like normal.  If you don't, get you something to eat and drink and lay down and focus on feeling your baby move.   If your baby is still not moving like normal, you should call the office or go to [place] to Help You Sleep Better:   Get into a bedtime routine, try to do the same thing every night before going to bed to try to help your body wind down  Warm baths  Avoid caffeine for at least 3 hours before going to sleep   Keep your room at a slightly cooler temperature, can try running a fan  Turn off TV, lights, phone, electronics  Lots of pillows if needed to help you get comfortable  Lavender scented items can help you sleep. You can place lavender essential oil on a cotton ball and place under your pillowcase, or place in a diffuser. [employer] has a lavender scented sleep line (plug-ins, sprays, etc). Look in the pillow aisle for lavender scented pillows.   If none of the above things help, you can try 1/2 to 1 tablet of benadryl, unisom, or tylenol pm. Do not take this every night, only when you really need it.    [place] Pediatricians/Family Doctors:  [facility] [phone]                 [place] [phone] (usually not accepting new patients unless you have  family there already, you are always welcome to call and ask)            [facility] ([place]) [phone]   [facility] Pediatricians/Family Doctors:   [place]: [phone]  [facility]: [phone]   Second Trimester of Pregnancy The second trimester is from week 13 through week 28, months 4 through 6. The second trimester is often a time when you feel your best. Your body has also adjusted to being pregnant, and you begin to feel better physically. Usually, morning sickness has lessened or quit completely, you may have more energy, and you may have an increase in appetite. The second trimester is also a time when the fetus is growing rapidly. At the end of the sixth month, the fetus is about 9 inches long and weighs about 1 pounds. You will likely begin to feel the baby move (quickening) between 18 and 20 weeks of the pregnancy. BODY CHANGES Your body goes through many changes during pregnancy. The changes vary from woman to woman.  14. Your weight will continue to increase. You will notice your lower abdomen bulging out. 15. You may begin to get stretch marks on your hips, abdomen, and breasts. 16. You may develop headaches that can  be relieved by medicines approved by your health care provider. 17. You may urinate more often because the fetus is pressing on your bladder. 18. You may develop or continue to have heartburn as a result of your pregnancy. 19. You may develop constipation because certain hormones are causing the muscles that push waste through your intestines to slow down. 20. You may develop hemorrhoids or swollen, bulging veins (varicose veins). 21. You may have back pain because of the weight gain and pregnancy hormones relaxing your joints between the bones in your pelvis and as a result of a shift in weight and the muscles that support your balance. 22. Your breasts will continue to grow and be tender. 23. Your  gums may bleed and may be sensitive to brushing and flossing. 24. Dark spots or blotches (chloasma, mask of pregnancy) may develop on your face. This will likely fade after the baby is born. 25. A dark line from your belly button to the pubic area (linea nigra) may appear. This will likely fade after the baby is born. 26. You may have changes in your hair. These can include thickening of your hair, rapid growth, and changes in texture. Some women also have hair loss during or after pregnancy, or hair that feels dry or thin. Your hair will most likely return to normal after your baby is born. WHAT TO EXPECT AT YOUR PRENATAL VISITS During a routine prenatal visit: 2. You will be weighed to make sure you and the fetus are growing normally. 3. Your blood pressure will be taken. 4. Your abdomen will be measured to track your baby's growth. 5. The fetal heartbeat will be listened to. 6. Any test results from the previous visit will be discussed. Your health care provider may ask you: 2. How you are feeling. 3. If you are feeling the baby move. 4. If you have had any abnormal symptoms, such as leaking fluid, bleeding, severe headaches, or abdominal cramping. 5. If you have any questions. Other tests that may be performed during your second trimester include: 2. Blood tests that check for: 1. Low iron levels (anemia). 2. Gestational diabetes (between 24 and 28 weeks). 3. Rh antibodies. 3. Urine tests to check for infections, diabetes, or protein in the urine. 4. An ultrasound to confirm the proper growth and development of the baby. 5. An amniocentesis to check for possible genetic problems. 6. Fetal screens for spina bifida and Down syndrome. HOME CARE INSTRUCTIONS  3. Avoid all smoking, herbs, alcohol, and unprescribed drugs. These chemicals affect the formation and growth of the baby. 4. Follow your health care provider's instructions regarding medicine use. There are medicines that are either  safe or unsafe to take during pregnancy. 5. Exercise only as directed by your health care provider. Experiencing uterine cramps is a good sign to stop exercising. 6. Continue to eat regular, healthy meals. 7. Wear a good support bra for breast tenderness. 8. Do not use hot tubs, steam rooms, or saunas. 9. Wear your seat belt at all times when driving. 10. Avoid raw meat, uncooked cheese, cat litter boxes, and soil used by cats. These carry germs that can cause birth defects in the baby. 11. Take your prenatal vitamins. 12. Try taking a stool softener (if your health care provider approves) if you develop constipation. Eat more high-fiber foods, such as fresh vegetables or fruit and whole grains. Drink plenty of fluids to keep your urine clear or pale yellow. 13. Take warm sitz baths to soothe any  pain or discomfort caused by hemorrhoids. Use hemorrhoid cream if your health care provider approves. 14. If you develop varicose veins, wear support hose. Elevate your feet for 15 minutes, 3-4 times a day. Limit salt in your diet. 15. Avoid heavy lifting, wear low heel shoes, and practice good posture. 16. Rest with your legs elevated if you have leg cramps or low back pain. 71. Visit your dentist if you have not gone yet during your pregnancy. Use a soft toothbrush to brush your teeth and be gentle when you floss. 18. A sexual relationship may be continued unless your health care provider directs you otherwise. 19. Continue to go to all your prenatal visits as directed by your health care provider. SEEK MEDICAL CARE IF:   You have dizziness.  You have mild pelvic cramps, pelvic pressure, or nagging pain in the abdominal area.  You have persistent nausea, vomiting, or diarrhea.  You have a bad smelling vaginal discharge.  You have pain with urination. SEEK IMMEDIATE MEDICAL CARE IF:   You have a fever.  You are leaking fluid from your vagina.  You have spotting or bleeding from your  vagina.  You have severe abdominal cramping or pain.  You have rapid weight gain or loss.  You have shortness of breath with chest pain.  You notice sudden or extreme swelling of your face, hands, ankles, feet, or legs.  You have not felt your baby move in over an hour.  You have severe headaches that do not go away with medicine.  You have vision changes. Document Released: 04/23/2001 Document Revised: 05/04/2013 Document Reviewed: 06/30/2012 [facility] Patient Information 2015 [place], [place]. This information is not intended to replace advice given to you by your health care provider. Make sure you discuss any questions you have with your health care provider.

## 2015-03-07 ENCOUNTER — Other Ambulatory Visit: Payer: Self-pay | Admitting: Women's Health

## 2015-03-07 DIAGNOSIS — IMO0002 Reserved for concepts with insufficient information to code with codable children: Secondary | ICD-10-CM

## 2015-03-07 DIAGNOSIS — Z0489 Encounter for examination and observation for other specified reasons: Secondary | ICD-10-CM

## 2015-03-08 ENCOUNTER — Ambulatory Visit (INDEPENDENT_AMBULATORY_CARE_PROVIDER_SITE_OTHER): Payer: Medicaid Other | Admitting: Obstetrics and Gynecology

## 2015-03-08 ENCOUNTER — Other Ambulatory Visit: Payer: Medicaid Other

## 2015-03-08 ENCOUNTER — Ambulatory Visit (INDEPENDENT_AMBULATORY_CARE_PROVIDER_SITE_OTHER): Payer: Medicaid Other

## 2015-03-08 VITALS — BP 110/60 | HR 94 | Wt 290.6 lb

## 2015-03-08 DIAGNOSIS — Z1389 Encounter for screening for other disorder: Secondary | ICD-10-CM

## 2015-03-08 DIAGNOSIS — Z0489 Encounter for examination and observation for other specified reasons: Secondary | ICD-10-CM

## 2015-03-08 DIAGNOSIS — Z36 Encounter for antenatal screening of mother: Secondary | ICD-10-CM | POA: Diagnosis not present

## 2015-03-08 DIAGNOSIS — IMO0002 Reserved for concepts with insufficient information to code with codable children: Secondary | ICD-10-CM

## 2015-03-08 DIAGNOSIS — Z369 Encounter for antenatal screening, unspecified: Secondary | ICD-10-CM

## 2015-03-08 DIAGNOSIS — Z131 Encounter for screening for diabetes mellitus: Secondary | ICD-10-CM

## 2015-03-08 DIAGNOSIS — Z3492 Encounter for supervision of normal pregnancy, unspecified, second trimester: Secondary | ICD-10-CM

## 2015-03-08 DIAGNOSIS — Z331 Pregnant state, incidental: Secondary | ICD-10-CM

## 2015-03-08 LAB — POCT URINALYSIS DIPSTICK
Blood, UA: 3
Glucose, UA: NEGATIVE
Ketones, UA: NEGATIVE
Leukocytes, UA: NEGATIVE
NITRITE UA: NEGATIVE
PROTEIN UA: NEGATIVE

## 2015-03-08 MED ORDER — OMEPRAZOLE 20 MG PO CPDR: 20.0000 mg | DELAYED_RELEASE_CAPSULE | Freq: Every day | ORAL | Status: DC

## 2015-03-08 NOTE — Progress Notes (Addendum)
[place] 48+2LMB,EML pl gr 1,cephalic,cx 5.4,GBE of fluid 4.9cm,fhr 151 bpm,face and heart anatomy complete,no obvious abn seen,normal ov's bilat,efw 1247g 65%

## 2015-03-08 NOTE — Progress Notes (Signed)
Patient ID: Sue Nguyen, female   DOB: 17-Oct-1980, 34 y.o.   MRN: [id number] G2P0010 [redacted]w[redacted]d Estimated Date of Delivery: 06/02/15  Blood pressure 110/60, pulse 94, weight 290 lb 9.6 oz (131.815 kg), last menstrual period 08/26/2014.   refer to the ob flow sheet for FH and FHR, also BP, Wt, Urine results: negative  FHR: 156 FH: 29cm Patient reports + good fetal movement, denies any bleeding and no rupture of membranes symptoms or regular contractions. Patient complaints: Heartburn.    Questions were answered. Assessment:  Plan:  Continued routine obstetrical care; meds for heartburn   F/u in 4 weeks for continued obstetrical care   By signing my name below, I, Terressa Koyanagi, attest that this documentation has been prepared under the direction and in the presence of Mallory Shirk, MD. Electronically Signed: Terressa Koyanagi, ED Scribe. 03/08/2015. 10:17 AM.   I personally performed the services described in this documentation, which was SCRIBED in my presence. The recorded information has been reviewed and considered accurate. It has been edited as necessary during review. Jonnie Kind, MD

## 2015-03-09 LAB — CBC
HEMOGLOBIN: 10.1 g/dL — AB (ref 11.1–15.9)
Hematocrit: 31.3 % — ABNORMAL LOW (ref 34.0–46.6)
MCH: 23.3 pg — ABNORMAL LOW (ref 26.6–33.0)
MCHC: 32.3 g/dL (ref 31.5–35.7)
MCV: 72 fL — ABNORMAL LOW (ref 79–97)
Platelets: 238 10*3/uL (ref 150–379)
RBC: 4.33 x10E6/uL (ref 3.77–5.28)
RDW: 14.9 % (ref 12.3–15.4)
WBC: 9.3 10*3/uL (ref 3.4–10.8)

## 2015-03-09 LAB — GLUCOSE TOLERANCE, 2 HOURS W/ 1HR
GLUCOSE, FASTING: 84 mg/dL (ref 65–91)
Glucose, 1 hour: 126 mg/dL (ref 65–179)
Glucose, 2 hour: 118 mg/dL (ref 65–152)

## 2015-03-09 LAB — RPR: RPR Ser Ql: NONREACTIVE

## 2015-03-09 LAB — ANTIBODY SCREEN: ANTIBODY SCREEN: NEGATIVE

## 2015-03-09 LAB — HIV ANTIBODY (ROUTINE TESTING W REFLEX): HIV Screen 4th Generation wRfx: NONREACTIVE

## 2015-03-13 ENCOUNTER — Telehealth: Payer: Self-pay | Admitting: *Deleted

## 2015-03-13 ENCOUNTER — Other Ambulatory Visit: Payer: Self-pay | Admitting: Women's Health

## 2015-03-13 MED ORDER — FERROUS SULFATE 325 (65 FE) MG PO TABS: 325.0000 mg | ORAL_TABLET | Freq: Two times a day (BID) | ORAL | Status: DC

## 2015-03-13 NOTE — Telephone Encounter (Signed)
Pt informed of anemia and of need to take iron RX in addition to PNV and increase iron rich foods in diet.  Pt voiced understanding.

## 2015-04-05 ENCOUNTER — Encounter: Payer: Self-pay | Admitting: Obstetrics and Gynecology

## 2015-04-05 ENCOUNTER — Ambulatory Visit (INDEPENDENT_AMBULATORY_CARE_PROVIDER_SITE_OTHER): Payer: Medicaid Other | Admitting: Obstetrics and Gynecology

## 2015-04-05 VITALS — BP 122/80 | HR 100 | Wt 290.5 lb

## 2015-04-05 DIAGNOSIS — Z331 Pregnant state, incidental: Secondary | ICD-10-CM

## 2015-04-05 DIAGNOSIS — Z1389 Encounter for screening for other disorder: Secondary | ICD-10-CM

## 2015-04-05 DIAGNOSIS — Z3483 Encounter for supervision of other normal pregnancy, third trimester: Secondary | ICD-10-CM

## 2015-04-05 LAB — POCT URINALYSIS DIPSTICK
Blood, UA: 1
Glucose, UA: NEGATIVE
Ketones, UA: NEGATIVE
LEUKOCYTES UA: NEGATIVE
Nitrite, UA: NEGATIVE
PROTEIN UA: NEGATIVE

## 2015-04-05 NOTE — Progress Notes (Signed)
Pt states that she has a few questions she would to ask Dr. Glo Herring. Pt states that she wants to pump and not breast feed, pt had questions about this and questions were answered.

## 2015-04-05 NOTE — Progress Notes (Signed)
Patient ID: Sue Nguyen, female   DOB: 1980/12/23, 34 y.o.   MRN: [id number]  G2P0010 [redacted]w[redacted]d Estimated Date of Delivery: 06/02/15  Blood pressure 122/80, pulse 100, weight 290 lb 8 oz (131.77 kg), last menstrual period 08/26/2014.   refer to the ob flow sheet for FH and FHR, also BP, Wt, Urine results: 1 blood  Patient reports  + good fetal movement, denies any bleeding and no rupture of membranes symptoms or regular contractions. Patient complaints: She has no complaints at this time  FHR: 151 FH: 32cm Edema: none  Questions were answered.  Assessment: [redacted]w[redacted]d G2P0010  Plan:   Continued routine obstetrical care,  F/u in 2 weeks for LROB  By signing my name below, I, Erling Conte, attest that this documentation has been prepared under the direction and in the presence of Jonnie Kind, MD. Electronically Signed: Erling Conte, ED Scribe. 04/05/2015. 10:38 AM.  I personally performed the services described in this documentation, which was SCRIBED in my presence. The recorded information has been reviewed and considered accurate. It has been edited as necessary during review. Jonnie Kind, MD

## 2015-04-19 ENCOUNTER — Ambulatory Visit (INDEPENDENT_AMBULATORY_CARE_PROVIDER_SITE_OTHER): Payer: Medicaid Other | Admitting: Obstetrics and Gynecology

## 2015-04-19 ENCOUNTER — Encounter: Payer: Self-pay | Admitting: Obstetrics and Gynecology

## 2015-04-19 VITALS — BP 122/80 | HR 100 | Wt 291.0 lb

## 2015-04-19 DIAGNOSIS — Z3493 Encounter for supervision of normal pregnancy, unspecified, third trimester: Secondary | ICD-10-CM

## 2015-04-19 DIAGNOSIS — Z3483 Encounter for supervision of other normal pregnancy, third trimester: Secondary | ICD-10-CM

## 2015-04-19 DIAGNOSIS — R319 Hematuria, unspecified: Secondary | ICD-10-CM

## 2015-04-19 DIAGNOSIS — Z331 Pregnant state, incidental: Secondary | ICD-10-CM

## 2015-04-19 DIAGNOSIS — Z3A34 34 weeks gestation of pregnancy: Secondary | ICD-10-CM

## 2015-04-19 DIAGNOSIS — Z1389 Encounter for screening for other disorder: Secondary | ICD-10-CM

## 2015-04-19 LAB — POCT URINALYSIS DIPSTICK
GLUCOSE UA: NEGATIVE
KETONES UA: NEGATIVE
Leukocytes, UA: NEGATIVE
Nitrite, UA: NEGATIVE
PROTEIN UA: NEGATIVE
RBC UA: 3

## 2015-04-19 NOTE — Progress Notes (Signed)
Patient ID: Sue Nguyen, female   DOB: [birth year], 34 y.o.   MRN: [id number]  G2P0010 [redacted]w[redacted]d Estimated Date of Delivery: 06/02/15  Blood pressure 122/80, pulse 100, weight 291 lb (131.997 kg), last menstrual period 08/26/2014.   refer to the ob flow sheet for FH and FHR, also BP, Wt, Urine results:notable for 3+ blood   Patient reports  + good fetal movement, denies any bleeding and no rupture of membranes symptoms or regular contractions. Patient complaints: Patient notes epigastric pressure that started this morning. She also complains of nausea. Patient states she has been walking 3 miles for exercise.  BP 122/80 mmHg  Pulse 100  Wt 291 lb (131.997 kg)  LMP 08/26/2014 (Exact Date) FHR: 149 bpm FH: 35 cm  Questions were answered.   Assessment: LROB G2P0010 @ [redacted]w[redacted]d                        Encouraged childbirth classes                                             Obesity stable, active Plan:  Continued routine obstetrical care,   Urine Culture sent  F/u in 2 weeks for pnx care    By signing my name below, I, Stephania Fragmin, attest that this documentation has been prepared under the direction and in the presence of Jonnie Kind, MD. Electronically Signed: Stephania Fragmin, ED Scribe. 04/19/2015. 10:35 AM.   I personally performed the services described in this documentation, which was SCRIBED in my presence. The recorded information has been reviewed and considered accurate. It has been edited as necessary during review. Jonnie Kind, MD  (scribe attestation statement)

## 2015-04-19 NOTE — Progress Notes (Signed)
Pt states that she had a pressure in the top of her stomach that started this morning.

## 2015-04-20 LAB — URINE CULTURE

## 2015-04-24 ENCOUNTER — Telehealth: Payer: Self-pay | Admitting: Obstetrics and Gynecology

## 2015-04-24 NOTE — Telephone Encounter (Signed)
Pt aware that her urine results were normal and that she would not need any treatment. Pt stated that she had to go to the ED for lower back pain over the weekend. Pt states that she went to [facility] and that they told her that she may have a UTI. The pt stated that her next appointment isn't until next week. I advised the pt we would work her in for a follow up on her ED visit. Phone call was switched to front office and appointment was given.

## 2015-04-24 NOTE — Telephone Encounter (Signed)
Pt called stating that she would like to know the results of the culture he sent out on her last visit. Please contact pt

## 2015-04-26 ENCOUNTER — Ambulatory Visit (INDEPENDENT_AMBULATORY_CARE_PROVIDER_SITE_OTHER): Payer: Medicaid Other | Admitting: Obstetrics and Gynecology

## 2015-04-26 ENCOUNTER — Encounter: Payer: Self-pay | Admitting: Obstetrics and Gynecology

## 2015-04-26 VITALS — BP 100/60 | HR 104 | Wt 291.0 lb

## 2015-04-26 DIAGNOSIS — Z1389 Encounter for screening for other disorder: Secondary | ICD-10-CM

## 2015-04-26 DIAGNOSIS — Z331 Pregnant state, incidental: Secondary | ICD-10-CM

## 2015-04-26 DIAGNOSIS — Z3A35 35 weeks gestation of pregnancy: Secondary | ICD-10-CM

## 2015-04-26 DIAGNOSIS — O2340 Unspecified infection of urinary tract in pregnancy, unspecified trimester: Secondary | ICD-10-CM | POA: Insufficient documentation

## 2015-04-26 DIAGNOSIS — O2343 Unspecified infection of urinary tract in pregnancy, third trimester: Secondary | ICD-10-CM

## 2015-04-26 LAB — POCT URINALYSIS DIPSTICK
Glucose, UA: NEGATIVE
KETONES UA: NEGATIVE
Leukocytes, UA: NEGATIVE
Nitrite, UA: NEGATIVE

## 2015-04-26 MED ORDER — NITROFURANTOIN MONOHYD MACRO 100 MG PO CAPS: 100.0000 mg | ORAL_CAPSULE | Freq: Two times a day (BID) | ORAL | Status: DC

## 2015-04-26 NOTE — Progress Notes (Signed)
Patient ID: Sue Nguyen, female   DOB: 1981/04/15, 34 y.o.   MRN: [id number]  G2P0010 [redacted]w[redacted]d Estimated Date of Delivery: 06/02/15  Blood pressure 100/60, pulse 104, weight 291 lb (131.997 kg), last menstrual period 08/26/2014.   refer to the ob flow sheet for FH and FHR, also BP, Wt, Urine results: trace blood present  Patient reports  + good fetal movement, denies any bleeding and no rupture of membranes symptoms or regular contractions. Patient complaints: Pt here for follow up of moderate, intermittent lower back pain, lasting 6-7 minutes, after being seen at [facility] ED. She was evaluated for UTI, but was not treated for urinary symptoms. U/A was pos for rbc, wbc, epithelials, not cultured Pt states that she has a history of frequent UTI and notes her current back pain does not feel similar. Pt is scheduled for a tour of [facility] on 05/19/2014.  FHR- 137 FH- 36 cm  U/a here + 3+ blood today. BacK; pain in area of right posterior superior iliac spine Leg neg Uterus nontender. Questions were answered. Assessment: LROB G2P0010 @ [redacted]w[redacted]d.  Hx recurent uti's Probable UTI rx macrobid x 7d then suppress HS No signs of systemic infection or DVT.  Lower back pain, possibly secondary to right SI joint strain.   Plan:  Continued routine obstetrical care, treat for possible UTI as indicated by trace blood in urinalysis. Advised pt of conservative treatments for pain management and use heat.   F/u in 2 weeks for routine prenatal care.   By signing my name below, I, Hansel Feinstein, attest that this documentation has been prepared under the direction and in the presence of Jonnie Kind, MD. Electronically Signed: Hansel Feinstein, ED Scribe. 04/26/2015. 12:43 PM.  `Attestation of Attending Supervision of Advanced Practitioner: Evaluation and management procedures were performed by the PA/NP/CNM/OB Fellow under my supervision/collaboration. Chart reviewed and agree with management and  plan. t Laurice Iglesia V 04/26/2015 1:06 PM    s

## 2015-05-03 ENCOUNTER — Ambulatory Visit (INDEPENDENT_AMBULATORY_CARE_PROVIDER_SITE_OTHER): Payer: Medicaid Other | Admitting: Obstetrics and Gynecology

## 2015-05-03 ENCOUNTER — Encounter: Payer: Self-pay | Admitting: Obstetrics and Gynecology

## 2015-05-03 VITALS — BP 100/60 | HR 110 | Wt 291.5 lb

## 2015-05-03 DIAGNOSIS — Z331 Pregnant state, incidental: Secondary | ICD-10-CM

## 2015-05-03 DIAGNOSIS — Z1389 Encounter for screening for other disorder: Secondary | ICD-10-CM

## 2015-05-03 DIAGNOSIS — Z3493 Encounter for supervision of normal pregnancy, unspecified, third trimester: Secondary | ICD-10-CM

## 2015-05-03 LAB — POCT URINALYSIS DIPSTICK
Blood, UA: 4
Glucose, UA: NEGATIVE
Ketones, UA: NEGATIVE
Leukocytes, UA: NEGATIVE
Nitrite, UA: NEGATIVE

## 2015-05-03 NOTE — Progress Notes (Signed)
Patient ID: Sue Nguyen, female   DOB: [birth year], 34 y.o.   MRN: [id number]  G2P0010 [redacted]w[redacted]d Estimated Date of Delivery: 06/02/15  Blood pressure 100/60, pulse 110, weight 291 lb 8 oz (132.224 kg), last menstrual period 08/26/2014.   refer to the ob flow sheet for FH and FHR, also BP, Wt, Urine results:notable for negative   Patient reports  + good fetal movement, denies any bleeding and no rupture of membranes symptoms or regular contractions. Patient complaints: none.  FHR- 159 bpm  FH- 40 cm  Questions were answered. Assessment: LROB G2P0010 @ [redacted]w[redacted]d GBS next week   Plan:  Continued routine obstetrical care  F/u in 2 weeks for routine prenatal care; in one week for GBS    By signing my name below, I, Hansel Feinstein, attest that this documentation has been prepared under the direction and in the presence of Jonnie Kind, MD. Electronically Signed: Hansel Feinstein, ED Scribe. 05/03/2015. 11:21 AM.  I personally performed the services described in this documentation, which was SCRIBED in my presence. The recorded information has been reviewed and considered accurate. It has been edited as necessary during review. Jonnie Kind, MD

## 2015-05-03 NOTE — Progress Notes (Signed)
Pt denies any problems or concerns at this time. Pt wants to know if she can find out how much the baby was.

## 2015-05-10 ENCOUNTER — Ambulatory Visit (INDEPENDENT_AMBULATORY_CARE_PROVIDER_SITE_OTHER): Payer: Medicaid Other | Admitting: Obstetrics and Gynecology

## 2015-05-10 ENCOUNTER — Encounter: Payer: Self-pay | Admitting: Obstetrics and Gynecology

## 2015-05-10 VITALS — BP 112/70 | HR 102 | Wt 301.0 lb

## 2015-05-10 DIAGNOSIS — Z3A37 37 weeks gestation of pregnancy: Secondary | ICD-10-CM

## 2015-05-10 DIAGNOSIS — Z331 Pregnant state, incidental: Secondary | ICD-10-CM

## 2015-05-10 DIAGNOSIS — Z3483 Encounter for supervision of other normal pregnancy, third trimester: Secondary | ICD-10-CM

## 2015-05-10 DIAGNOSIS — Z1389 Encounter for screening for other disorder: Secondary | ICD-10-CM

## 2015-05-10 DIAGNOSIS — Z369 Encounter for antenatal screening, unspecified: Secondary | ICD-10-CM

## 2015-05-10 LAB — POCT URINALYSIS DIPSTICK
Glucose, UA: NEGATIVE
KETONES UA: NEGATIVE
Leukocytes, UA: NEGATIVE
Nitrite, UA: NEGATIVE
Protein, UA: NEGATIVE

## 2015-05-10 NOTE — Progress Notes (Signed)
G2P0010 [redacted]w[redacted]d Estimated Date of Delivery: 06/02/15  Blood pressure 112/70, pulse 102, weight 301 lb (136.533 kg), last menstrual period 08/26/2014.  'excess weight gain 10lb noted. Over xmas week refer to the ob flow sheet for FH and FHR, also BP, Wt, Urine results:notable for neg protein, 2+ blood but on ABX from last week. Has had neg culture DEC 7   Patient reports   good fetal movement, denies any bleeding and no rupture of membranes symptoms or regular contractions. Patient complaints:having braxton hicks.prn.  Questions were answered. Assessment: LROB G2P0010 @ [redacted]w[redacted]d  Excess weight gain over Xmas., no PIH sx.                  Plan:  Continued routine obstetrical care, weekly LROB  F/u in 1 weeks for lrob

## 2015-05-12 LAB — GC/CHLAMYDIA PROBE AMP
Chlamydia trachomatis, NAA: NEGATIVE
Neisseria gonorrhoeae by PCR: NEGATIVE

## 2015-05-12 LAB — STREP GP B NAA: Strep Gp B NAA: NEGATIVE

## 2015-05-14 NOTE — L&D Delivery Note (Signed)
Patient is 35 y.o. G2P0010 [redacted]w[redacted]d admitted for PROM that necessitated augmentation/induction with Cytotec and pitocin. Patient progressed normally. Length of rupture was ~24 hours. She progressed to complete. She pushed effectively. The head delivered at 0257 and shoulder dystocia was identified. Mcroberts, suprapubic and side lying were performed. I attempted rubins/woodscrew/reverse wood screw without ability to dislodge the shoulder dystocia. Delivery of the posterior arm alleviated the shoulder dystocia. Of note, mother was also closing her legs during attempted delivery of the shoulder despite coaching. Total length of shoulder dystocia = 1 minute, affected right shoulder. Infant was moving arm without difficulty.   Delivery Note At 2:58 AM a viable female was delivered via Vaginal, Spontaneous Delivery (Presentation:OA).  APGAR: 9,9 ; weight-pending  .   Placenta status: Intact, Spontaneous.  Cord:  with the following complications: None.  Cord pH: not collected  Anesthesia: Epidural  Episiotomy:  None Lacerations:  3rd Degree Laceration. External anal sphincter repaired with two interrupted sutures. Capsule repaired with interrupted sutures. 2nd degree repaired in the typical fashion.  Suture Repair: 3.0 vicryl Est. Blood Loss (mL):  100  Mom to postpartum.  Baby to Couplet care / Skin to Skin.  Juanita Craver [facility] 05/30/2015, 3:45 AM

## 2015-05-17 ENCOUNTER — Ambulatory Visit (INDEPENDENT_AMBULATORY_CARE_PROVIDER_SITE_OTHER): Payer: Medicaid Other | Admitting: Obstetrics and Gynecology

## 2015-05-17 ENCOUNTER — Encounter: Payer: Self-pay | Admitting: Obstetrics and Gynecology

## 2015-05-17 VITALS — BP 100/60 | HR 106 | Wt 296.5 lb

## 2015-05-17 DIAGNOSIS — Z331 Pregnant state, incidental: Secondary | ICD-10-CM

## 2015-05-17 DIAGNOSIS — Z1389 Encounter for screening for other disorder: Secondary | ICD-10-CM

## 2015-05-17 DIAGNOSIS — Z3493 Encounter for supervision of normal pregnancy, unspecified, third trimester: Secondary | ICD-10-CM

## 2015-05-17 LAB — POCT URINALYSIS DIPSTICK
Blood, UA: NEGATIVE
Glucose, UA: NEGATIVE
KETONES UA: NEGATIVE
Leukocytes, UA: NEGATIVE
Nitrite, UA: NEGATIVE

## 2015-05-17 NOTE — Progress Notes (Signed)
Pt denies any problems or concerns at this time.  

## 2015-05-17 NOTE — Progress Notes (Signed)
G2P0010 [redacted]w[redacted]d Estimated Date of Delivery: 06/02/15  Blood pressure 100/60, pulse 106, weight 296 lb 8 oz (134.492 kg), last menstrual period 08/26/2014.   refer to the ob flow sheet for FH and FHR, also BP, Wt, Urine results:notable for trace protein has completed abx for ?uti  Patient reports   good fetal movement, denies any bleeding and no rupture of membranes symptoms or regular contractions. Patient complaints:none has dropped.  Questions were answered. Assessment: LROB G2P0010 @ [redacted]w[redacted]d . Stable for now  GBS neg  Plan:  Continued routine obstetrical care, q 1 wk visit  F/u in 1 weeks for lrob

## 2015-05-24 ENCOUNTER — Encounter: Payer: Self-pay | Admitting: Obstetrics and Gynecology

## 2015-05-24 ENCOUNTER — Ambulatory Visit (INDEPENDENT_AMBULATORY_CARE_PROVIDER_SITE_OTHER): Payer: Medicaid Other | Admitting: Obstetrics and Gynecology

## 2015-05-24 VITALS — BP 120/80 | HR 103 | Wt 298.0 lb

## 2015-05-24 DIAGNOSIS — Z3A39 39 weeks gestation of pregnancy: Secondary | ICD-10-CM

## 2015-05-24 DIAGNOSIS — Z1389 Encounter for screening for other disorder: Secondary | ICD-10-CM

## 2015-05-24 DIAGNOSIS — Z331 Pregnant state, incidental: Secondary | ICD-10-CM

## 2015-05-24 DIAGNOSIS — Z3493 Encounter for supervision of normal pregnancy, unspecified, third trimester: Secondary | ICD-10-CM

## 2015-05-24 DIAGNOSIS — Z3483 Encounter for supervision of other normal pregnancy, third trimester: Secondary | ICD-10-CM | POA: Diagnosis not present

## 2015-05-24 LAB — POCT URINALYSIS DIPSTICK
GLUCOSE UA: NEGATIVE
KETONES UA: NEGATIVE
Leukocytes, UA: NEGATIVE
Nitrite, UA: NEGATIVE
Protein, UA: NEGATIVE
RBC UA: 1

## 2015-05-24 NOTE — Progress Notes (Signed)
Pt denies any problems or concerns at this time.  

## 2015-05-24 NOTE — Progress Notes (Signed)
Patient ID: Sue Nguyen, female   DOB: 09-Feb-1981, 35 y.o.   MRN: [id number]  G2P0010 [redacted]w[redacted]d Estimated Date of Delivery: 06/02/15  Blood pressure 120/80, pulse 103, weight 298 lb (135.172 kg), last menstrual period 08/26/2014.   refer to the ob flow sheet for FH and FHR, also BP, Wt, Urine results:notable for +1 blood  Patient reports  + good fetal movement, denies any bleeding and no rupture of membranes symptoms or regular contractions.  Patient complaints: she reports occasional Braxton Hicks contractions, but no regular contractions. She also notes a mild feeling of abdominal distention. She denies vaginal bleeding, vaginal discharge.    FHR- 142 bpm  FH- 41 cm  Cervix- long and os  Questions were answered. Assessment: LROB G2P0010 @ [redacted]w[redacted]d  Pt strep B negative on 05/10/15 Passed glucose tolerance test on 03/08/15 No PIH sx Cervix long and os  closed  Plan:  Continued routine obstetrical care Continue nipple stimulation 2-3x/day  Continue light exercise  Possible IOL at 41 weeks    F/u in 1 weeks for routine prenatal care     By signing my name below, I, Hansel Feinstein, attest that this documentation has been prepared under the direction and in the presence of Jonnie Kind, MD. Electronically Signed: Hansel Feinstein, ED Scribe. 05/24/2015. 10:16 AM.  I personally performed the services described in this documentation, which was SCRIBED in my presence. The recorded information has been reviewed and considered accurate. It has been edited as necessary during review. Jonnie Kind, MD

## 2015-05-29 ENCOUNTER — Encounter (HOSPITAL_COMMUNITY): Payer: Self-pay | Admitting: *Deleted

## 2015-05-29 ENCOUNTER — Encounter (HOSPITAL_COMMUNITY): Payer: Self-pay | Admitting: Anesthesiology

## 2015-05-29 ENCOUNTER — Inpatient Hospital Stay (HOSPITAL_COMMUNITY)
Admission: AD | Admit: 2015-05-29 | Discharge: 2015-05-31 | DRG: 774 | Disposition: A | Discharge status: Home / Self Care | Payer: Medicaid Other | Source: Ambulatory Visit | Attending: Obstetrics & Gynecology | Admitting: Obstetrics & Gynecology

## 2015-05-29 DIAGNOSIS — Z3490 Encounter for supervision of normal pregnancy, unspecified, unspecified trimester: Secondary | ICD-10-CM

## 2015-05-29 DIAGNOSIS — O4290 Premature rupture of membranes, unspecified as to length of time between rupture and onset of labor, unspecified weeks of gestation: Secondary | ICD-10-CM | POA: Diagnosis present

## 2015-05-29 DIAGNOSIS — Z3A39 39 weeks gestation of pregnancy: Secondary | ICD-10-CM | POA: Diagnosis not present

## 2015-05-29 DIAGNOSIS — O4202 Full-term premature rupture of membranes, onset of labor within 24 hours of rupture: Secondary | ICD-10-CM | POA: Diagnosis present

## 2015-05-29 DIAGNOSIS — Z87891 Personal history of nicotine dependence: Secondary | ICD-10-CM | POA: Diagnosis not present

## 2015-05-29 DIAGNOSIS — N390 Urinary tract infection, site not specified: Secondary | ICD-10-CM | POA: Diagnosis not present

## 2015-05-29 DIAGNOSIS — O2340 Unspecified infection of urinary tract in pregnancy, unspecified trimester: Secondary | ICD-10-CM | POA: Diagnosis present

## 2015-05-29 DIAGNOSIS — O4292 Full-term premature rupture of membranes, unspecified as to length of time between rupture and onset of labor: Secondary | ICD-10-CM | POA: Diagnosis not present

## 2015-05-29 LAB — POCT FERN TEST: POCT FERN TEST: POSITIVE

## 2015-05-29 LAB — CBC
HCT: 33.3 % — ABNORMAL LOW (ref 36.0–46.0)
HEMOGLOBIN: 10.5 g/dL — AB (ref 12.0–15.0)
MCH: 23.6 pg — ABNORMAL LOW (ref 26.0–34.0)
MCHC: 31.5 g/dL (ref 30.0–36.0)
MCV: 75 fL — ABNORMAL LOW (ref 78.0–100.0)
Platelets: 232 10*3/uL (ref 150–400)
RBC: 4.44 MIL/uL (ref 3.87–5.11)
RDW: 14.4 % (ref 11.5–15.5)
WBC: 12.5 10*3/uL — AB (ref 4.0–10.5)

## 2015-05-29 LAB — ABO/RH: ABO/RH(D): B POS

## 2015-05-29 LAB — TYPE AND SCREEN
ABO/RH(D): B POS
ANTIBODY SCREEN: NEGATIVE

## 2015-05-29 MED ORDER — TERBUTALINE SULFATE 1 MG/ML IJ SOLN
0.2500 mg | Freq: Once | INTRAMUSCULAR | Status: DC | PRN
  Filled 2015-05-29: qty 1

## 2015-05-29 MED ORDER — ACETAMINOPHEN 325 MG PO TABS: 650.0000 mg | ORAL_TABLET | ORAL | Status: DC | PRN

## 2015-05-29 MED ORDER — CITRIC ACID-SODIUM CITRATE 334-500 MG/5ML PO SOLN: 30.0000 mL | ORAL | Status: DC | PRN

## 2015-05-29 MED ORDER — LACTATED RINGERS IV SOLN: 500.0000 mL | INTRAVENOUS | Status: DC | PRN

## 2015-05-29 MED ORDER — MISOPROSTOL 200 MCG PO TABS
50.0000 ug | ORAL_TABLET | ORAL | Status: DC
  Administered 2015-05-29: 50 ug via ORAL
  Filled 2015-05-29: qty 0.5

## 2015-05-29 MED ORDER — FENTANYL CITRATE (PF) 100 MCG/2ML IJ SOLN
50.0000 ug | INTRAMUSCULAR | Status: DC | PRN
  Administered 2015-05-29 (×2): 100 ug via INTRAVENOUS
  Filled 2015-05-29 (×3): qty 2

## 2015-05-29 MED ORDER — OXYCODONE-ACETAMINOPHEN 5-325 MG PO TABS: 1.0000 | ORAL_TABLET | ORAL | Status: DC | PRN

## 2015-05-29 MED ORDER — OXYTOCIN 10 UNIT/ML IJ SOLN
1.0000 m[IU]/min | INTRAVENOUS | Status: DC
  Administered 2015-05-29: 2 m[IU]/min via INTRAVENOUS

## 2015-05-29 MED ORDER — FLEET ENEMA 7-19 GM/118ML RE ENEM: 1.0000 | ENEMA | RECTAL | Status: DC | PRN

## 2015-05-29 MED ORDER — OXYTOCIN 10 UNIT/ML IJ SOLN
2.5000 [IU]/h | INTRAMUSCULAR | Status: DC
  Filled 2015-05-29: qty 4

## 2015-05-29 MED ORDER — LACTATED RINGERS IV SOLN
INTRAVENOUS | Status: DC
  Administered 2015-05-29: 09:00:00 via INTRAVENOUS

## 2015-05-29 MED ORDER — OXYCODONE-ACETAMINOPHEN 5-325 MG PO TABS: 2.0000 | ORAL_TABLET | ORAL | Status: DC | PRN

## 2015-05-29 MED ORDER — ONDANSETRON HCL 4 MG/2ML IJ SOLN: 4.0000 mg | Freq: Four times a day (QID) | INTRAMUSCULAR | Status: DC | PRN

## 2015-05-29 MED ORDER — LIDOCAINE HCL (PF) 1 % IJ SOLN
30.0000 mL | INTRAMUSCULAR | Status: DC | PRN
  Filled 2015-05-29: qty 30

## 2015-05-29 MED ORDER — OXYTOCIN BOLUS FROM INFUSION: 500.0000 mL | INTRAVENOUS | Status: DC

## 2015-05-29 NOTE — MAU Note (Signed)
Pt reports ROM at 0309, some contractions.

## 2015-05-29 NOTE — Progress Notes (Signed)
Labor Progress Note  Sue Nguyen is a 35 y.o. G2P0010 at [redacted]w[redacted]d  admitted for PROM @ 0309 1/16  S:  Doing well. Having significant contractions. Would like to try to avoid pain medications.     O:  BP 124/82 mmHg  Pulse 93  Temp(Src) 98.3 F (36.8 C) (Oral)  Resp 20  Ht 6\' 1"  (1.854 m)  Wt 135.172 kg (298 lb)  BMI 39.32 kg/m2  SpO2 99%  LMP 08/26/2014 (Exact Date)     FHT:  FHR: 140 bpm, variability: moderate,  accelerations:  Abscent,  decelerations:  Absent UC:   Every 54mins SVE:   Dilation: 2 Effacement (%): 70 Station: -2 Exam by:: Airiel Oblinger MD SROM @ 0309 1/16   Pitocin @  81m/hr  Labs: Lab Results  Component Value Date   WBC 12.5* 05/29/2015   HGB 10.5* 05/29/2015   HCT 33.3* 05/29/2015   MCV 75.0* 05/29/2015   PLT 232 05/29/2015    Assessment / Plan: 35 y.o. G2P0010 [redacted]w[redacted]d in early labor Induction of labor due to PROM,  progressing well on pitocin  Labor: Progressing on Pitocin Fetal Wellbeing:  Category I Pain Control:  Labor support without medications Anticipated MOD:  NSVD  Expectant management   Smiley Houseman, MD PGY 1 Family Medicine    OB fellow attestation:  I have seen and examined this patient; I agree with above documentation in the resident's note.   Caren Macadam, MD 11:51 PM

## 2015-05-29 NOTE — Progress Notes (Signed)
Labor Progress Note Sue Nguyen is a 35 y.o. G2P0010 at [redacted]w[redacted]d presented for PROM at term  S: reports crampy contractions, she is able to talk through them.   O:  BP 128/85 mmHg  Pulse 88  Temp(Src) 98.5 F (36.9 C) (Oral)  Resp 20  Ht 6\' 1"  (1.854 m)  Wt 298 lb (135.172 kg)  BMI 39.32 kg/m2  SpO2 99%  LMP 08/26/2014 (Exact Date) EFM: 140/mod/+accels, no decels  CVE: Dilation: 1.5 Effacement (%): 70 Cervical Position: Posterior Station: -2 Presentation: Vertex Exam by:: Etter Sjogren RN   A&P: 35 y.o. G2P0010 [redacted]w[redacted]d here for term PROM #Labor: Discussed birth plan. Plans on low intervention delivery. She has IV in place. Discussed risk of infection at 18 hrs and augmentation of labor. Patient accepts cytotec at 8 hrs post rupture which will be 1130. Plan to start cytotec 93mcg q4. Discussed possibility of pitocin in the future.  #Pain: coping skills, movement.  #FWB: Cat I. Intermittent monitoring ordered.  #GBS negative  Caren Macadam, MD 9:19 AM

## 2015-05-29 NOTE — H&P (Signed)
LABOR ADMISSION HISTORY AND PHYSICAL  Sue Nguyen is a 35 y.o. female G2P0010 with IUP at [redacted]w[redacted]d by LMP c/w [redacted]w[redacted]d [place] presenting for PROM at approximately 0309 1/16. She reports light yellow-pink leakage of fluid. No contractions. Noted of fetal movement during the drive to [facility], but fetus has been quiet while in [facility] (notes this is the time fetus usually sleeps). Reports no VB, no blurry vision, headaches or peripheral edema, and RUQ pain.  She plans on bottle feeding. She is planning on abstinence for birth control.  Dating: By  LMP and [redacted]w[redacted]d [place] --->  Estimated Date of Delivery: 06/02/15  Sono:    @[redacted]w[redacted]d , CWD, limited view of heart and face, cephalic presentation, [id number]  @ [redacted]w[redacted]d normal anatomy    Prenatal History/Complications:  Hx of UTI x 2 during pregnancy (treated). Patient reports she was not placed on prophylaxis  Depression on Celexa  Past Medical History: Past Medical History  Diagnosis Date  . Mental disorder     moody  . Obesity   . Breast nodule 10/19/2013    Will get [place]  . Contraceptive management 10/19/2013  . Vaginal Pap smear, abnormal   . UTI (lower urinary tract infection) 07/05/2014    Past Surgical History: Past Surgical History  Procedure Laterality Date  . Cervical biopsy    . Cholecystectomy  04/2013    Obstetrical History: OB History    Gravida Para Term Preterm AB TAB SAB Ectopic Multiple Living   2    1 1           Social History: Social History   Social History  . Marital Status: Married    Spouse Name: N/A  . Number of Children: N/A  . Years of Education: N/A   Social History Main Topics  . Smoking status: Former Smoker    Types: Cigarettes    Quit date: 10/17/2014  . Smokeless tobacco: Never Used  . Alcohol Use: No     Comment: occ  . Drug Use: No  . Sexual Activity: Not Currently    Birth Control/ Protection: None   Other Topics Concern  . None   Social History Narrative    Family History: Family History  Problem Relation Age  of Onset  . Alcohol abuse Other     mom's side of family  . Cancer Maternal Grandfather     lung  . Alcohol abuse Maternal Grandfather   . Alcohol abuse Maternal Grandmother     recovering alcoholic    Allergies: No Known Allergies  Prescriptions prior to admission  Medication Sig Dispense Refill Last Dose  . acetaminophen (TYLENOL) 325 MG tablet Take 650 mg by mouth as needed.   05/28/2015 at Unknown time  . citalopram (CELEXA) 20 MG tablet TAKE ONE TABLET BY MOUTH ONCE DAILY 30 tablet 11 05/28/2015 at Unknown time  . ferrous sulfate 325 (65 FE) MG tablet Take 1 tablet (325 mg total) by mouth 2 (two) times daily with a meal. 60 tablet 3 05/28/2015 at Unknown time  . Prenatal Multivit-Min-Fe-FA (PRENATAL VITAMINS PO) Take 1 tablet by mouth daily.   05/28/2015 at Unknown time  .       . omeprazole (PRILOSEC) 20 MG capsule Take 1 capsule (20 mg total) by mouth daily. 30 capsule 3 Taking    Review of Systems   All systems reviewed and negative except as stated in HPI  BP 139/78 mmHg  Pulse 98  Temp(Src) 98.7 F (37.1 C) (Oral)  Resp 20  Ht 6\' 1"  (1.854 m)  Wt 135.172 kg (298 lb)  BMI 39.32 kg/m2  SpO2 99%  LMP 08/26/2014 (Exact Date) General appearance: alert and cooperative Lungs: clear to auscultation bilaterally Heart: regular rate and rhythm Abdomen: soft, non-tender; bowel sounds normal Extremities: Homans sign is negative, no sign of DVT, edema  Fetal monitoringBaseline: 140 bpm, Variability: Fair (1-6 bpm), Accelerations: Reactive and Decelerations: Absent Uterine activity; none  Dilation: 1.5 Effacement (%): 70 Station: -2 Exam by:: Etter Sjogren RN   Prenatal labs: ABO, Rh: B/Positive/-- (07/07 1441) Antibody: Negative (10/26 0852) Rubella: immune RPR: Non Reactive (10/26 0852)  HBsAg: Negative (07/07 1441)  HIV: Non Reactive (10/26 0852)  GBS: Negative (12/28 1545)  2 hr Glucola: nml 84/126/118 Genetic screening: declined Anatomy US: female  limited view, repeat @[redacted]w[redacted]d  normal  Prenatal Transfer Tool  Maternal Diabetes: No Genetic Screening: Declined Maternal Ultrasounds/Referrals: Normal Fetal Ultrasounds or other Referrals:  None Maternal Substance Abuse:  No Significant Maternal Medications:  Meds include: Other: Celexa Significant Maternal Lab Results: Lab values include: Group B Strep negative  Results for orders placed or performed during the hospital encounter of 05/29/15 (from the past 24 hour(s))  [facility] Time: 05/29/15  5:58 AM  Result Value Ref Range   POCT Fern Test Positive = ruptured amniotic membanes     Patient Active Problem List   Diagnosis Date Noted  . UTI (urinary tract infection) during pregnancy, recurrent 04/26/2015  . Supervision of low-risk pregnancy 11/17/2014  . Breast nodule 10/19/2013  . 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 G2P0010 at [redacted]w[redacted]d here for PROM at approximately 0309 1/16.   #Labor: consider IOL with cytotec or FB #Pain: Analgesia per pt request #FWB:  Cat2  #ID: GBS neg #MOF: bottle #MOC: abstinence  #Circ: N/A  Smiley Houseman, MD PGY 1 Family Medicine

## 2015-05-29 NOTE — Progress Notes (Signed)
Labor Progress Note SHARALYN MIKELS is a 35 y.o. G2P0010 at [redacted]w[redacted]d presented for PROM at term  S: Went walking. S/p cytotec x1  O:  BP 133/73 mmHg  Pulse 97  Temp(Src) 97.7 F (36.5 C) (Oral)  Resp 20  Ht 6\' 1"  (1.854 m)  Wt 298 lb (135.172 kg)  BMI 39.32 kg/m2  SpO2 99%  LMP 08/26/2014 (Exact Date) EFM: 140/mod/+accels, no decels  CVE: Dilation: 2 Effacement (%): 70 Cervical Position: Posterior Station: -2 Presentation: Vertex Exam by:: Cooper Stamp MD   A&P: 35 y.o. G2P0010 [redacted]w[redacted]d here for term PROM #Labor: Start pitocin 2x2. #Pain: coping skills, movement.  #FWB: Cat I. Intermittent monitoring ordered.  #GBS negative  Caren Macadam, MD 4:35 PM

## 2015-05-30 ENCOUNTER — Inpatient Hospital Stay (HOSPITAL_COMMUNITY): Payer: Medicaid Other | Admitting: Anesthesiology

## 2015-05-30 ENCOUNTER — Encounter (HOSPITAL_COMMUNITY): Payer: Self-pay | Admitting: Anesthesiology

## 2015-05-30 DIAGNOSIS — Z3A39 39 weeks gestation of pregnancy: Secondary | ICD-10-CM

## 2015-05-30 DIAGNOSIS — Z87891 Personal history of nicotine dependence: Secondary | ICD-10-CM

## 2015-05-30 DIAGNOSIS — O4292 Full-term premature rupture of membranes, unspecified as to length of time between rupture and onset of labor: Secondary | ICD-10-CM

## 2015-05-30 DIAGNOSIS — N390 Urinary tract infection, site not specified: Secondary | ICD-10-CM

## 2015-05-30 LAB — RPR: RPR: NONREACTIVE

## 2015-05-30 MED ORDER — CITALOPRAM HYDROBROMIDE 20 MG PO TABS
20.0000 mg | ORAL_TABLET | Freq: Every day | ORAL | Status: DC
  Administered 2015-05-30: 20 mg via ORAL
  Filled 2015-05-30: qty 1

## 2015-05-30 MED ORDER — DIBUCAINE 1 % RE OINT: 1.0000 "application " | TOPICAL_OINTMENT | RECTAL | Status: DC | PRN

## 2015-05-30 MED ORDER — OXYTOCIN 10 UNIT/ML IJ SOLN
10.0000 [IU] | Freq: Once | INTRAMUSCULAR | Status: AC
  Administered 2015-05-30: 10 [IU] via INTRAMUSCULAR

## 2015-05-30 MED ORDER — DIPHENHYDRAMINE HCL 25 MG PO CAPS: 25.0000 mg | ORAL_CAPSULE | Freq: Four times a day (QID) | ORAL | Status: DC | PRN

## 2015-05-30 MED ORDER — FENTANYL 2.5 MCG/ML BUPIVACAINE 1/10 % EPIDURAL INFUSION (WH - ANES)
14.0000 mL/h | INTRAMUSCULAR | Status: DC | PRN
  Administered 2015-05-30: 14 mL/h via EPIDURAL

## 2015-05-30 MED ORDER — EPHEDRINE 5 MG/ML INJ
10.0000 mg | INTRAVENOUS | Status: DC | PRN
  Filled 2015-05-30: qty 2

## 2015-05-30 MED ORDER — OXYTOCIN 10 UNIT/ML IJ SOLN
INTRAMUSCULAR | Status: AC
Start: 2015-05-30 — End: 2015-05-30
  Filled 2015-05-30: qty 1

## 2015-05-30 MED ORDER — IBUPROFEN 600 MG PO TABS
600.0000 mg | ORAL_TABLET | Freq: Four times a day (QID) | ORAL | Status: DC
  Administered 2015-05-30 – 2015-05-31 (×6): 600 mg via ORAL
  Filled 2015-05-30 (×6): qty 1

## 2015-05-30 MED ORDER — SIMETHICONE 80 MG PO CHEW: 80.0000 mg | CHEWABLE_TABLET | ORAL | Status: DC | PRN

## 2015-05-30 MED ORDER — ACETAMINOPHEN 325 MG PO TABS: 650.0000 mg | ORAL_TABLET | ORAL | Status: DC | PRN

## 2015-05-30 MED ORDER — PRENATAL MULTIVITAMIN CH
1.0000 | ORAL_TABLET | Freq: Every day | ORAL | Status: DC
Start: 2015-05-30 — End: 2015-05-31
  Administered 2015-05-30 – 2015-05-31 (×2): 1 via ORAL
  Filled 2015-05-30 (×2): qty 1

## 2015-05-30 MED ORDER — ZOLPIDEM TARTRATE 5 MG PO TABS: 5.0000 mg | ORAL_TABLET | Freq: Every evening | ORAL | Status: DC | PRN

## 2015-05-30 MED ORDER — LANOLIN HYDROUS EX OINT: TOPICAL_OINTMENT | CUTANEOUS | Status: DC | PRN

## 2015-05-30 MED ORDER — OXYCODONE-ACETAMINOPHEN 5-325 MG PO TABS: 2.0000 | ORAL_TABLET | ORAL | Status: DC | PRN

## 2015-05-30 MED ORDER — WITCH HAZEL-GLYCERIN EX PADS: 1.0000 "application " | MEDICATED_PAD | CUTANEOUS | Status: DC | PRN

## 2015-05-30 MED ORDER — PHENYLEPHRINE 40 MCG/ML (10ML) SYRINGE FOR IV PUSH (FOR BLOOD PRESSURE SUPPORT)
80.0000 ug | PREFILLED_SYRINGE | INTRAVENOUS | Status: DC | PRN
Start: 2015-05-30 — End: 2015-05-30
  Filled 2015-05-30: qty 2

## 2015-05-30 MED ORDER — OXYCODONE-ACETAMINOPHEN 5-325 MG PO TABS: 1.0000 | ORAL_TABLET | ORAL | Status: DC | PRN

## 2015-05-30 MED ORDER — ONDANSETRON HCL 4 MG/2ML IJ SOLN: 4.0000 mg | INTRAMUSCULAR | Status: DC | PRN

## 2015-05-30 MED ORDER — PHENYLEPHRINE 40 MCG/ML (10ML) SYRINGE FOR IV PUSH (FOR BLOOD PRESSURE SUPPORT)
PREFILLED_SYRINGE | INTRAVENOUS | Status: DC
Start: 2015-05-30 — End: 2015-05-30
  Filled 2015-05-30: qty 20

## 2015-05-30 MED ORDER — FENTANYL 2.5 MCG/ML BUPIVACAINE 1/10 % EPIDURAL INFUSION (WH - ANES)
INTRAMUSCULAR | Status: AC
  Administered 2015-05-30: 02:00:00
  Filled 2015-05-30: qty 125

## 2015-05-30 MED ORDER — LIDOCAINE HCL (PF) 1 % IJ SOLN
INTRAMUSCULAR | Status: DC | PRN
  Administered 2015-05-30: 6 mL
  Administered 2015-05-30: 5 mL

## 2015-05-30 MED ORDER — DIPHENHYDRAMINE HCL 50 MG/ML IJ SOLN: 12.5000 mg | INTRAMUSCULAR | Status: DC | PRN

## 2015-05-30 MED ORDER — LIDOCAINE HCL (PF) 1 % IJ SOLN
30.0000 mL | Freq: Once | INTRAMUSCULAR | Status: AC
  Administered 2015-05-30: 30 mL

## 2015-05-30 MED ORDER — TETANUS-DIPHTH-ACELL PERTUSSIS 5-2.5-18.5 LF-MCG/0.5 IM SUSP: 0.5000 mL | Freq: Once | INTRAMUSCULAR | Status: DC

## 2015-05-30 MED ORDER — BENZOCAINE-MENTHOL 20-0.5 % EX AERO
1.0000 "application " | INHALATION_SPRAY | CUTANEOUS | Status: DC | PRN
  Administered 2015-05-30: 1 via TOPICAL
  Filled 2015-05-30: qty 56

## 2015-05-30 MED ORDER — ONDANSETRON HCL 4 MG PO TABS: 4.0000 mg | ORAL_TABLET | ORAL | Status: DC | PRN

## 2015-05-30 MED ORDER — SENNOSIDES-DOCUSATE SODIUM 8.6-50 MG PO TABS
2.0000 | ORAL_TABLET | ORAL | Status: DC
Start: 2015-05-31 — End: 2015-05-31
  Administered 2015-05-30: 2 via ORAL
  Filled 2015-05-30: qty 2

## 2015-05-30 NOTE — Anesthesia Preprocedure Evaluation (Signed)
Anesthesia Evaluation  Patient identified by MRN, date of birth, ID band Patient awake    Reviewed: Allergy & Precautions, NPO status , Patient's Chart, lab work & pertinent test results  Airway Mallampati: II  TM Distance: >3 FB Neck ROM: Full    Dental no notable dental hx.    Pulmonary former smoker,    Pulmonary exam normal breath sounds clear to auscultation       Cardiovascular negative cardio ROS Normal cardiovascular exam Rhythm:Regular Rate:Normal     Neuro/Psych PSYCHIATRIC DISORDERS negative neurological ROS     GI/Hepatic negative GI ROS, Neg liver ROS,   Endo/Other  Morbid obesity  Renal/GU negative Renal ROS  negative genitourinary   Musculoskeletal negative musculoskeletal ROS (+)   Abdominal (+) + obese,   Peds negative pediatric ROS (+)  Hematology negative hematology ROS (+)   Anesthesia Other Findings   Reproductive/Obstetrics negative OB ROS                             Anesthesia Physical Anesthesia Plan  ASA: III  Anesthesia Plan: Epidural   Post-op Pain Management:    Induction: Intravenous  Airway Management Planned:   Additional Equipment:   Intra-op Plan:   Post-operative Plan: Extubation in OR  Informed Consent: I have reviewed the patients History and Physical, chart, labs and discussed the procedure including the risks, benefits and alternatives for the proposed anesthesia with the patient or authorized representative who has indicated his/her understanding and acceptance.   Dental advisory given  Plan Discussed with: CRNA  Anesthesia Plan Comments:         Anesthesia Quick Evaluation

## 2015-05-30 NOTE — Progress Notes (Signed)
MOB was referred for history of depression/anxiety.  Referral is screened out by Clinical Social Worker because none of the following criteria appear to apply: -History of anxiety/depression during this pregnancy, or of post-partum depression. - Diagnosis of anxiety and/or depression within last 3 years - History of depression due to pregnancy loss/loss of child or -MOB's symptoms are currently being treated with medication and/or therapy. MOB is currently prescribed Celexa, no concerns during the pregnancy secondary to her depression.  Please contact the Clinical Social Worker if needs arise or upon MOB request.

## 2015-05-30 NOTE — Anesthesia Postprocedure Evaluation (Signed)
Anesthesia Post Note  Patient: Sue Nguyen  Procedure(s) Performed: * No procedures listed *  Patient location during evaluation: Mother Baby Anesthesia Type: Epidural Level of consciousness: awake and alert Pain management: pain level controlled Vital Signs Assessment: post-procedure vital signs reviewed and stable Respiratory status: spontaneous breathing Cardiovascular status: blood pressure returned to baseline and stable Postop Assessment: no headache, no backache, patient able to bend at knees and no signs of nausea or vomiting Anesthetic complications: no    Last Vitals:  Filed Vitals:   05/30/15 1713 05/30/15 1800  BP: 117/81 120/68  Pulse: 107 97  Temp: 36.8 C 37 C  Resp:  18    Last Pain:  Filed Vitals:   05/30/15 2119  PainSc: 6                  Garrell Flagg

## 2015-05-30 NOTE — Progress Notes (Signed)
Labor Progress Note  AZHARIA MIKOLAJCZAK is a 35 y.o. G2P0010 at [redacted]w[redacted]d  admitted for PROM @ 0309 1/16  S:  Doing well. Having significant contractions. Feeling rectal pressure.   O:  BP 134/66 mmHg  Pulse 96  Temp(Src) 98.3 F (36.8 C) (Oral)  Resp 20  Ht 6\' 1"  (1.854 m)  Wt 298 lb (135.172 kg)  BMI 39.32 kg/m2  SpO2 99%  LMP 08/26/2014 (Exact Date)    FHT:  FHR: 140 bpm, variability: moderate,  accelerations:  Abscent,  decelerations:  Absent UC:   Every 52mins SVE:   Dilation: 7 Effacement (%): 100 Station: -1 Exam by:: [name] SROM @ 0309 1/16    Labs: Lab Results  Component Value Date   WBC 12.5* 05/29/2015   HGB 10.5* 05/29/2015   HCT 33.3* 05/29/2015   MCV 75.0* 05/29/2015   PLT 232 05/29/2015    Assessment / Plan: 35 y.o. G2P0010 [redacted]w[redacted]d in early labor Induction of labor due to PROM,  progressing well on pitocin  Labor: Progressing on Pitocin. Pitocin @ 14 Fetal Wellbeing:  Category I Pain Control:  Labor support without medications Anticipated MOD:  NSVD  Caren Macadam, MD

## 2015-05-30 NOTE — Anesthesia Procedure Notes (Signed)
Epidural Patient location during procedure: OB Start time: 05/30/2015 2:00 AM  Staffing Anesthesiologist: Franne Grip Performed by: anesthesiologist   Preanesthetic Checklist Completed: patient identified, site marked, surgical consent, pre-op evaluation, timeout performed, IV checked, risks and benefits discussed, monitors and equipment checked and post-op pain management  Epidural Patient position: sitting Prep: DuraPrep Patient monitoring: heart rate and blood pressure Approach: midline Location: L3-L4 Injection technique: LOR air  Needle:  Needle type: Tuohy  Needle gauge: 17 G Needle length: 9 cm and 9 Needle insertion depth: 6 cm Catheter type: closed end flexible Catheter size: 19 Gauge Catheter at skin depth: 10 cm Test dose: negative and Other  Assessment Events: blood not aspirated, injection not painful, no injection resistance, negative IV test and no paresthesia  Additional Notes Test dose 1.0% Lidocaine   Patient tolerated the insertion well without complications.Reason for block:post-op pain management

## 2015-05-31 ENCOUNTER — Encounter: Payer: Medicaid Other | Admitting: Obstetrics and Gynecology

## 2015-05-31 MED ORDER — SENNOSIDES-DOCUSATE SODIUM 8.6-50 MG PO TABS: 1.0000 | ORAL_TABLET | Freq: Every day | ORAL | Status: DC

## 2015-05-31 MED ORDER — DOCUSATE SODIUM 100 MG PO CAPS: 100.0000 mg | ORAL_CAPSULE | Freq: Two times a day (BID) | ORAL | Status: DC

## 2015-05-31 MED ORDER — OXYCODONE-ACETAMINOPHEN 5-325 MG PO TABS: 1.0000 | ORAL_TABLET | ORAL | Status: DC | PRN

## 2015-05-31 MED ORDER — IBUPROFEN 600 MG PO TABS: 600.0000 mg | ORAL_TABLET | Freq: Four times a day (QID) | ORAL | Status: DC | PRN

## 2015-05-31 NOTE — Discharge Instructions (Signed)

## 2015-05-31 NOTE — Progress Notes (Signed)
Sue Nguyen is a 35 y.o. [id number] who presented with PROM without the onset of labor. Delivery was complicated by a 1 minute shoulder dystocia and 3rd degree laceration. Postpartum course has been uncomplicated.   In sign out this morning the patient was reported as wanting to be discharged home.   I was asked to see the patient and her family due to concerns that the family had about the patient's delivery course by the Resident Sue Nguyen who saw the patient in the morning. I also received sign out from Dr. Laurey Arrow who saw the patient this morning.   Present in the room:  Sue Nguyen- sitting in the bed with infant Sue Nguyen- Sue Nguyen, seated on the couch Sue Nguyen- seated on the chair in the corner Sue Nguyen - MD and Sue Nguyen Fellow- standing at patient bed, right side.  Sue Nguyen - PGY1 Resident, standing along the wall  I started the meeting by addressing that I heard they had concerns about the patient's labor, delivery and care in general. The patient's Sue Nguyen Sue Nguyen was the primary communicator. She detailed multiplel concerns about the patient's labor course. She reported her "Sue Nguyen got bad care" and "she felt like an experiment." She reported that "no one discussed the plan during labor" and that "not one talked to her about the rupture of her cervix and how her cervix was."  I recommended that I detail the course of the patient's labor and welcomed any additional facts so that I could help explain to the patient about the events and facts regarding her labor and delivery. The Sue Nguyen Sue Nguyen) interrupted multiple times. I attempted to use active listen and reflected back that "I was sorry she felt that things were not explained." I also informed/reminded the family that I was with the patient from 1/16 at 8:00 AM until 1/17 at 8 AM and was the physician that had updated them throughout the process and the Sue Nguyen Sue Nguyen) responded "no you didn't, you never told us what was going on"  and they "never heard that she should only dilate 1 cm every 2 hours." I reminded the patient that I said this when I first saw her and that after 6 cm first time mothers dilate 1 cm every 2 hours and the Sue Nguyen (Sue Nguyen) simply said "No you absolutely didn't" and I responded "I am sorry you feel that way" to which she said "I don't feel this way this is the truth."  Both parents seemed frustrated that they did not know about the possibility of delivery 24 hours after ROM.   Throughout this time the patient, Sue Nguyen, was only looking at her Sue Nguyen and was not talking. She was only spoke once and said "this was the most painful experience of my life."   Then shifted the conversation to discuss the delivery experience. I detailed how Sue Nguyen pushed effectively and said that the Sue Nguyen's right shoulder was stuck. I explained that this is an obstetrical emergency and I called for help. The Sue Nguyen Sue Nguyen) interrupted and said "you never said why you wanted help just every one rushed into the room."  I explained that I had identified the shoulder dystocia and that in the midst of chaos (of note parents had been screaming during the shoulder dystocia "Sue Nguyen Sue Nguyen's stuck, push Sue Nguyen") that she might not have heard me use those word. The Sue Nguyen Sue Nguyen) replied "no you didn't." I explained that with a shoulder dystocia "we have 3 minutes to remove the Sue Nguyen to reduce  the risk of brain injury."  I explained that our goal in obstetrics is a healthy Sue Nguyen and healthy mom. I explained that Sue Nguyen was brought to the warmer and started moving her arm and crying quickly. I explained that I had then returned to examine the patient. At the time of repair and during this discussion I said to the family "I explained that your tear was deep and was into the muscle around where your poop comes out." The Sue Nguyen said "No you didn't." I explained how I repaired the laceration and the implications. I also reinforced that after the delivery I stayed to  talk to the family about the complicated nature of the delivery.  Sue Nguyen said "yeah you did but you never said she had a 3rd degree rupture which is serious." I expressed that I "was sorry she did not feel I had discussed this with them in our post-delivery debrief." The Sue Nguyen Sue Nguyen) then proceed to yell at me. She called me "a bad doctor" and "you should have given her a C-section because my Sue Nguyen should not be in this pain and have trouble with her stool." I explained that the patient received the standard of care in terms of labor and delivery care to which the Sue Nguyen Sue Nguyen) said "yeah, substandard." I reinforced that C-section was not indicated given that the infant Sue Nguyen) never was distressed during labor and was not large on the growth scans obtained in her prenatal care." I reinforced that C-sections are a major abdominal surgery and at no point was there an indication to performed one.   The Sue Nguyen Sue Nguyen) also mentioned that she was "not seen by anyone until today the day that we are going home and this is not the care her Sue Nguyen deserves" and mentioned several times "her daughter was an experiment to you people." I reinforced that typically Sue Nguyen are not seen by the team until PPD#1.  I again tried to reinforce that the patient Sue Nguyen) received the standard of care and the care I would provide to friend or sister" and the Sue Nguyen Sue Nguyen) said "that is not true and everyone here at this hospital had provided bad care to my Sue Nguyen. We had the most beautiful pregnancy and this experience has been awful."  Sue Nguyen continued to raise her voice to me.  I was unable to redirect or speak. I interjected that I felt the conversation was "no longer productive or therapeutic and that I would inform Risk Management of their concerns regarding their care and have them speak to the patient and her family." The Sue Nguyen Sue Nguyen) said "yeah you better do that" and I left the room.   I approached the nursing station and  asked the secretary to called Risk Management. I explained that I need risk management and that I had just been yelled at by the family and they were concerned about their care here at [facility]. I was able to speak to [facility] Coverage directly and they were going to have Risk Management see the patient.   Sue Macadam, MD

## 2015-05-31 NOTE — Progress Notes (Signed)
Interim Note:   35 y.o. yo G2P1011 at [redacted]w[redacted]d was admitted with PROM on 05/29/2015. Patient had a SVD complicated by 1 minute shoulder dystocia and third degree laceration which was repaired.   On morning evaluation today on 05/30/14, patient's mother voiced that she had concerns and questions about the delivery course. She was upset and felt that her family was not informed about the events of the delivery until this morning. She asked if I and the treatment treatment team could come and discuss and also give her something in writing about the course of events. I agreed to return with the supervising physician.   I returned to patient's room with [name] to answer any concerns or questions the family had. [name] was present when patient was admitted, was involved in her care through her entire labor course, and delivered her now newborn. I was also involved in the patient's care and was present in the room during the delivery. Patient's parents were also present in the room during delivery and were very emotional during the 1 minute shoulder dystocia.   This morning patient's mother reported multiple concerns about how she felt that no one talked to the family about the labor course or the third degree laceration. She also reported that she felt that her daughter was being "experimented on" during the delivery and thought her daughter received "poor care" during her stay.  [name] respectfully stated that she did report the shoulder dystocia and the third degree laceration while in the delivery room, which I do recall as I was also present in the delivery room. However the patient's mother continued to deny this occurred. [name] explained again the delivery course and the complications of the delivery in detail in the room this morning which she again mentioned was the standard of care for all of our patients. Again, the patient's mother disagreed and continued to raise her voice at [name].  The conversation was not progressing to a shared understanding and was becoming unproductive. At this time, [name] stated that should will get Risk Management to further discuss their concerns. Patient continued to raise her voice.    Smiley Houseman, MD PGY 1 Family Medicine

## 2015-05-31 NOTE — Discharge Summary (Signed)
OB Discharge Summary     Patient Name: Sue Nguyen DOB: 25-Apr-1981 MRN: [id number]  Date of admission: 05/29/2015 Delivering MD: Lauretta Chester NILES   Date of discharge: 05/31/2015  Admitting diagnosis: 73 WEEKS ROM Intrauterine pregnancy: [redacted]w[redacted]d     Secondary diagnosis:  Principal Problem:   NSVD (normal spontaneous vaginal delivery) Active Problems:   Supervision of low-risk pregnancy   UTI (urinary tract infection) during pregnancy, recurrent   PROM (premature rupture of membranes)   Obstetrical laceration, third degree  Additional problems: 1 minute shoulder dystocia during delivery      Discharge diagnosis: Term Pregnancy Delivered                                                                                                Post partum procedures:repair of third degree laceration  Augmentation: Pitocin and Cytotec  Complications: None  Hospital course:  Onset of Labor With Vaginal Delivery     35 y.o. yo G2P1011 at [redacted]w[redacted]d was admitted with PROM on 05/29/2015. Patient's labor with induced with Cytotec then Pitocin. Patient had a complicated delivery: 1 minute shoulder dystocia.   Membrane Rupture Time/Date: 3:09 AM ,05/29/2015   Intrapartum Procedures: Episiotomy:                                           Lacerations:  3rd degree [4]  Patient had a delivery of a Viable infant. 05/30/2015  Information for the patient's newborn:  Sumera, Magnin [id number]  Delivery Method: Vaginal, Spontaneous Delivery (Filed from Delivery Summary)     Pateint had an uncomplicated postpartum course.  She is ambulating, tolerating a regular diet, passing flatus, and urinating well. Patient is discharged home in stable condition on 05/31/2015.  Home with colace, percocet, and fecal intolerance return precautions vis a vi 3rd degree perineal laceration.   Physical exam  Filed Vitals:   05/30/15 1031 05/30/15 1713 05/30/15 1800 05/31/15 0700  BP: 129/84 117/81 120/68 133/76  Pulse:  103 107 97 91  Temp: 98.4 F (36.9 C) 98.3 F (36.8 C) 98.6 F (37 C) 98.2 F (36.8 C)  TempSrc: Oral Oral Oral   Resp:   18 18  Height:      Weight:      SpO2: 100%      General: alert, cooperative and no distress Uterine Fundus: firm Incision: N/A DVT Evaluation: No evidence of DVT seen on physical exam. No cords or calf tenderness. No significant calf/ankle edema. Labs: Lab Results  Component Value Date   WBC 12.5* 05/29/2015   HGB 10.5* 05/29/2015   HCT 33.3* 05/29/2015   MCV 75.0* 05/29/2015   PLT 232 05/29/2015   CMP Latest Ref Rng 10/16/2012  Glucose 70 - 99 mg/dL 84  BUN 6 - 23 mg/dL 9  Creatinine 0.50 - 1.10 mg/dL 0.83  Sodium 135 - 145 mEq/L 137  Potassium 3.5 - 5.3 mEq/L 4.5  Chloride 96 - 112 mEq/L 105  CO2 19 - 32 mEq/L 25  Calcium 8.4 - 10.5 mg/dL 9.3  Total Protein 6.0 - 8.3 g/dL 6.9  Total Bilirubin 0.3 - 1.2 mg/dL 0.4  Alkaline Phos 39 - 117 U/L 43  AST 0 - 37 U/L 21  ALT 0 - 35 U/L 15    Discharge instruction: per After Visit Summary and "Baby and Me Booklet".  After visit meds:    Medication List    STOP taking these medications        acetaminophen 325 MG tablet  Commonly known as:  TYLENOL     diphenhydrAMINE 25 MG tablet  Commonly known as:  BENADRYL      TAKE these medications        citalopram 20 MG tablet  Commonly known as:  CELEXA  TAKE ONE TABLET BY MOUTH ONCE DAILY     CLARITIN 10 MG tablet  Generic drug:  loratadine  Take 10 mg by mouth daily.     docusate sodium 100 MG capsule  Commonly known as:  COLACE  Take 1 capsule (100 mg total) by mouth 2 (two) times daily.     ferrous sulfate 325 (65 FE) MG tablet  Take 1 tablet (325 mg total) by mouth 2 (two) times daily with a meal.     FISH OIL PO  Take 1 capsule by mouth daily.     GLUCOSAMINE PO  Take 1 tablet by mouth daily.     ibuprofen 600 MG tablet  Commonly known as:  ADVIL,MOTRIN  Take 1 tablet (600 mg total) by mouth every 6 (six) hours as needed.      omeprazole 20 MG capsule  Commonly known as:  PRILOSEC  Take 1 capsule (20 mg total) by mouth daily.     oxyCODONE-acetaminophen 5-325 MG tablet  Commonly known as:  PERCOCET/ROXICET  Take 1 tablet by mouth every 4 (four) hours as needed (for pain scale greater than or equal to 4 and less than 7).     PRENATAL VITAMINS PO  Take 1 tablet by mouth daily.     senna-docusate 8.6-50 MG tablet  Commonly known as:  Senokot-S  Take 1 tablet by mouth at bedtime.        Diet: routine diet  Activity: Advance as tolerated. Pelvic rest for 6 weeks.   Outpatient follow up:6 weeks Follow up Appt:No future appointments. Follow up Visit:No Follow-up on file.  Postpartum contraception: Abstinence  Newborn Data: Live born female  Birth Weight: 7 lb 1.6 oz (3220 g) APGAR: 9, 9  Baby Feeding: Breast Disposition:home with mother   05/31/2015 Smiley Houseman, MD   OB FELLOW DISCHARGE ATTESTATION  I have seen and examined this patient and agree with above documentation in the resident's note.   Desma Maxim, MD 5:10 PM

## 2015-06-05 ENCOUNTER — Encounter: Payer: Self-pay | Admitting: Obstetrics and Gynecology

## 2015-06-05 ENCOUNTER — Ambulatory Visit (INDEPENDENT_AMBULATORY_CARE_PROVIDER_SITE_OTHER): Payer: Medicaid Other | Admitting: Obstetrics and Gynecology

## 2015-06-05 DIAGNOSIS — F53 Puerperal psychosis: Secondary | ICD-10-CM | POA: Diagnosis not present

## 2015-06-05 NOTE — Progress Notes (Addendum)
Patient ID: Sue Nguyen, female   DOB: 1980/12/24, 35 y.o.   MRN: [id number] Subjective:     Sue Nguyen is a 35 y.o. female who presents for a postpartum visit. She is 6 days postpartum following a spontaneous vaginal delivery,notable for 1 minute shoulder dystocia and partial third degree laceration. I have fully reviewed the prenatal and intrapartum course. The delivery was at 37+ gestational weeks. Outcome: spontaneous vaginal delivery. Anesthesia: epidural. Postpartum course has been notable for the patient and her family's very pronounced dissatisfaction with her care I asked her to come in early so that I could address their concerns and hear their statements. Baby's course has been unremarkable. Baby is feeding by breast. Bleeding thin lochia. Bowel function is abnormal: patient was almost tearful over the fact that she lost control of her stool last night and soiled her clothing once. Bladder function is normal. Patient is not sexually active. Contraception method is abstinence. Postpartum depression screening: patient is already on an SSRI. She is staying with her family who is being very vigilant in watching for signs of depression..  Over an hour spent in patient examination followed by lengthy discussion in our office.  Review of Systems   Objective:    BP 140/80 mmHg  Ht 6\' 1"  (1.854 m)  Wt 285 lb (129.275 kg)  BMI 37.61 kg/m2  Breastfeeding? Yes  General:  alert, moderately obese and tearful, very connected to the baby maintaining appropriate attention   Breasts:  negative  Lungs:   Heart:    Abdomen: soft, non-tender; bowel sounds normal; no masses,  no organomegaly   Vulva:  normal and the patient initially refused to allow me to examine her, then allowed that I could "look and then when I cannot see the laceration and repair she did cautiously allow me toexamine the repair. The skin is intact with minimal asymmetry. The patient's mother was able to visualize thepatient's  tissues and stated that the healing was "better than she expected"  Vagina: no exam performed but the lochia appears normal  Cervix:  Speculum exam was not allowed by the patient  Corpus:   Adnexa:    Rectal Exam:         After the patient Sue Nguyen was examined, she came to my office for a lengthy discussion, accompanied by the baby her mother and her father. Patient, her family were very intense but civil. Sue Nguyen was initially offered a chance to speak. She had limited amount to say. Her first question was about whether she had an in "inverted" cervix.the patient and family were insistent that that's what they have been told she had during labor. After some back and forth question we agreed that probably they were saying she had an anteverted cervix which made sense to them because the exams were perceived by the patient as extremely difficult.I explained the patient that she did have a very deep pelvis and this made sense to the patient as she stated that the exams in the office had been somewhat challenging as well. Sue Nguyen went on to say that she perceived herself as being a very laid-back patient,but that the process of labor here was so difficult that she "didn't feel she was going to make it", and that she hoped just that the family would be able to tell the baby that she loved it. I allowed the patient to ventilate and express her feelings. Sue Nguyen's mother was the main spokesperson. During the initial examination she stated that she  should've known something would be wrong when it took 4 tries to get an IV started. Sue Nguyen's mother had significant dissatisfaction's over the perceived lack of communications during the labor process. She stated that had better communications occurred she would've been able to make better decisions. We then went through the labor and delivery notes to try to better understand her concerns. Sue Nguyen's mom, Nguyen father and Sue Nguyen confirmed that presented with rupture membranes"  at1.5 cm". All members of the family agreed that things were in good condition at 4 PM when she was examined by Sue Nguyen and was 2 cm 70% -2 and the Pitocin was begun.. At that time she felt the pain was bearable and she wished to stay with her birth plan which did not allow use of an epidural. At 8 PM a note by Sue Nguyen describes the cervix is unchanged. I do not know if this was a separate exam her simply carrying forward of the previous exam.the patient acknowledges that at this time she found the pain bearable. At 12:30 PM the patient states that requested Sue Nguyen who came back and examined the patient found her to be 7 cm 100% effaced -1 station. Sue Nguyen's mother critically states that no one told them this at the time.Sue Nguyen's mother repeats her concerned that she felt inadequate communications was occurring. The family had a critical series of statements regarding their primary nurse.when a second nurse came in, 'Sue Nguyen", the family felt she did not introduce herself adequately and told her to leave. The patient's stated "I got Mean with Sue Nguyen; she had to go. Sometime shortly before 2 AM the Sue Nguyen placed an epidural. The family felt the Sue Nguyen was sympathetic and supportive and his efforts were well received. His note is filed at 1:50 AM on 05/30/2015  Delivery note indicates delivery was at 2:58 AM, after 1 minute shoulder dystocia resolved by delivery of the posterior arm. Please see Dr.  Romona Nguyen note.Sue Nguyen's mother indicated that she felt she was about to lose both her daughter and her grandchild. She acknowledges being very vocal during the delivery process,, expressing of concerns and dissatisfaction. I stated to the family my belief that at no time was the mother or the baby at risk of being lost. Sue Nguyen's mother did not accept this statement as believable. I expressed my confidence in Sue Nguyen and the delivery team,which the family disagreed with. I accepted their  disagreement and acknowledged their opinion. Sue Nguyen's mother stated she had no interest in never seeing Sue Nguyen again, "except maybe in front of a judge." I directed questions to [facility] specifically regarding postpartum depression. I explained to her that since she had gone to the pregnancy in such a euphoric way,, and was now so upset, the postpartum depression was very likely to be a concern. Shakeria acknowledge the need to watch for postpartum depression and  stated that she would let someone know,, and the family reaffirmed that they would continue to be vigilant in their attention to Carroll's well-being. Care was already on an SSRI, Celexa, and 20 mg daily is her current dose. I reaffirmed to the family and to [place] that I would discuss this delivery experience with  Members of our delivery team,and reassured her we always want our patients to have a positive experience.    Assessment:     normal postpartum exam for one week status post vaginal delivery with third-degree partial laceration. Extreme family dissatisfaction with communications inpatient care around birth experience, far greater than explainable  by records as I review it, and outcome which was quite good  Plan:    1. Contraception: abstinence 2. We'll discuss this case with labor and delivery management, consider notifying risk management 3. Follow up in: 4 weeks or as needed.   4  At the end of the visit Brendaly insisted on a hug, as she had always done during the routine prenatal visits I spent over 60 minutes with the visit, with >50% was spent in counseling and coordination of care.

## 2015-06-05 NOTE — Progress Notes (Signed)
Patient ID: Sue Nguyen, female   DOB: 02-Oct-1980, 35 y.o.   MRN: [id number] Pt here today for follow up. Pt states that she has some soreness but no other complaints.

## 2015-06-19 ENCOUNTER — Encounter: Payer: Self-pay | Admitting: Obstetrics and Gynecology

## 2015-06-19 ENCOUNTER — Ambulatory Visit (INDEPENDENT_AMBULATORY_CARE_PROVIDER_SITE_OTHER): Payer: Medicaid Other | Admitting: Obstetrics and Gynecology

## 2015-06-19 DIAGNOSIS — R109 Unspecified abdominal pain: Secondary | ICD-10-CM

## 2015-06-19 DIAGNOSIS — K6289 Other specified diseases of anus and rectum: Secondary | ICD-10-CM

## 2015-06-19 DIAGNOSIS — Z9889 Other specified postprocedural states: Secondary | ICD-10-CM | POA: Diagnosis not present

## 2015-06-19 DIAGNOSIS — N939 Abnormal uterine and vaginal bleeding, unspecified: Secondary | ICD-10-CM | POA: Diagnosis not present

## 2015-06-19 NOTE — Progress Notes (Signed)
Patient ID: Sue Nguyen, female   DOB: [birth year], 35 y.o.   MRN: [id number]   [facility] Visit  Patient name: Sue Nguyen MRN [id number]  Date of birth: 22-Mar-1981  CC & HPI:  Sue Nguyen is a 35 y.o. female, with PMHx noted below, presenting today for f/u on postpartum visit. Pt had a spontaneous vaginal delivery at 37+ gestational weeks on 05/30/15 which was notable for 1 minute shoulder dystocia and partial third degree laceration. Today, pt complains of intermittent pain immediately preceding and during bowel movements. Pt also complains of persistent, mild, lower abd soreness and ongoing, intermittent vaginal bleeding. Pt denies any postpartum depression stating that "I was concerned but everything has been very good." Pt further denies any other Sx at this time. 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   ROS:  10 Systems reviewed and all are negative for acute change except as noted in the HPI. Pertinent History Reviewed:   Reviewed: Significant for cervical biopsy, cholecystectomy, spontaneous vaginal delivery at 37+ gestational weeks.  Medical         Past Medical History  Diagnosis Date  . Mental disorder     moody  . Obesity   . Breast nodule 10/19/2013    Will get [place]  . Contraceptive management 10/19/2013  . Vaginal Pap smear, abnormal   . UTI (lower urinary tract infection) 07/05/2014                              Surgical Hx:    Past Surgical History  Procedure Laterality Date  . Cervical biopsy    . Cholecystectomy  04/2013   Medications: Reviewed & Updated - see associated section                       Current outpatient prescriptions:  .  citalopram (CELEXA) 20 MG tablet, TAKE ONE TABLET BY MOUTH ONCE DAILY, Disp: 30 tablet, Rfl: 11 .  docusate sodium (COLACE) 100 MG capsule, Take 1 capsule (100 mg total) by mouth 2 (two) times daily., Disp: 30 capsule, Rfl: 0 .  ferrous sulfate 325 (65 FE) MG tablet, Take 1 tablet (325 mg total) by  mouth 2 (two) times daily with a meal., Disp: 60 tablet, Rfl: 3 .  Glucosamine HCl (GLUCOSAMINE PO), Take 1 tablet by mouth daily., Disp: , Rfl:  .  ibuprofen (ADVIL,MOTRIN) 600 MG tablet, Take 1 tablet (600 mg total) by mouth every 6 (six) hours as needed. (Patient not taking: Reported on 06/05/2015), Disp: 30 tablet, Rfl: 2 .  loratadine (CLARITIN) 10 MG tablet, Take 10 mg by mouth daily., Disp: , Rfl:  .  Omega-3 Fatty Acids (FISH OIL PO), Take 1 capsule by mouth daily., Disp: , Rfl:  .  omeprazole (PRILOSEC) 20 MG capsule, Take 1 capsule (20 mg total) by mouth daily., Disp: 30 capsule, Rfl: 3 .  oxyCODONE-acetaminophen (PERCOCET/ROXICET) 5-325 MG tablet, Take 1 tablet by mouth every 4 (four) hours as needed (for pain scale greater than or equal to 4 and less than 7). (Patient not taking: Reported on 06/05/2015), Disp: 30 tablet, Rfl: 0 .  Prenatal Multivit-Min-Fe-FA (PRENATAL VITAMINS PO), Take 1 tablet by mouth daily., Disp: , Rfl:  .  senna-docusate (SENOKOT-S) 8.6-50 MG tablet, Take 1 tablet by mouth at bedtime., Disp: 30 tablet, Rfl: 2   Social History: Reviewed -  reports that she quit smoking about 8 months ago. Her smoking use included Cigarettes. She has never used smokeless tobacco.  Objective Findings:  Vitals: Blood pressure 126/80, height 6\' 1"  (1.854 m), weight 275 lb (124.739 kg), currently breastfeeding.  Physical Examination: General appearance - alert, well appearing, and in no distress, oriented to person, place, and time and overweight Mental status - alert, oriented to person, place, and time, normal mood, behavior, speech, dress, motor activity, and thought processes Eyes - pupils equal and reactive, extraocular eye movements intact Pelvic - normal external genitalia, vulva, vagina, cervix, uterus and adnexa,  VULVA: normal appearing vulva with no masses, tenderness or lesions, healing well  VAGINA: normal appearing vagina with normal color and discharge, no lesions, locea  normal,  CERVIX: is closed  UTERUS: uterus is normal size, shape, consistency and nontender,  ADNEXA: normal adnexa in size, nontender and no masses, exam limited by habeas,  Extremities - peripheral pulses normal, no pedal edema, no clubbing or cyanosis  Assessment & Plan:   A: s/p 3rd degree laceration at delivery, now recovering well 1. Normal postpartum course, no evidence of postpartum depression.   P:  1. F/u in 4 weeks.      By signing my name below, I, Terressa Koyanagi, attest that this documentation has been prepared under the direction and in the presence of Mallory Shirk, MD. Electronically Signed: Terressa Koyanagi, ED Scribe. 06/19/2015. 10:49 AM.   I personally performed the services described in this documentation, which was SCRIBED in my presence. The recorded information has been reviewed and considered accurate. It has been edited as necessary during review. Jonnie Kind, MD

## 2015-06-19 NOTE — Progress Notes (Signed)
Patient ID: Sue Nguyen, female   DOB: 07-22-1980, 35 y.o.   MRN: [id number] Pt here today for follow up. Pt states that she has pain before during and after BM's. Pt states that she still has some soreness in her lower abdomen.

## 2015-07-14 ENCOUNTER — Encounter: Payer: Self-pay | Admitting: Obstetrics and Gynecology

## 2015-07-14 ENCOUNTER — Ambulatory Visit (INDEPENDENT_AMBULATORY_CARE_PROVIDER_SITE_OTHER): Payer: Medicaid Other | Admitting: Obstetrics and Gynecology

## 2015-07-14 NOTE — Progress Notes (Addendum)
  Subjective:  Sue Nguyen is a 35 y.o. female who presents for a 1 month, 2 weeks postpartum visit  Patient concerns: Pt reports that she has pain with her right groin that is constant. Pt notes that her pain is worsened with ambulation. Pt denies a bulge to the area.  Prenatal and intrapartum course notable for N/A   Patient is not sexually active. She is Breast feeding   The following portions of the patient's history were reviewed and updated as appropriate: allergies, current medications, past family history, past medical history, past surgical history and problem list.  Review of Systems    See Subjective, otherwise negative ROS.  Objective:  BP 120/80 mmHg  Ht 6' (1.829 m)  Wt 252 lb (114.306 kg)  BMI 34.17 kg/m2  Breastfeeding? Yes  General:  alert, cooperative and no distress     Lungs: clear to auscultation bilaterally  Heart:  regular rate and rhythm, S1, S2 normal, no murmur  Abdomen: soft, non-tender; bowel sounds normal; no masses,  no organomegaly   Vulva:  normal  Vagina: normal vagina  Cervix:  normal  Corpus: normal size, contour, position, consistency, mobility, non-tender  Adnexa:  normal adnexa          Assessment:  1.  Normal postpartum exam.  2. Contraception: Rx Micronor til she stops the Br feeding then Sprintec.  Plan:  1. Follow up PRN 2. Follow up in 1 year for gyn exam  By signing my name below, I, Soijett Blue, attest that this documentation has been prepared under the direction and in the presence of Jonnie Kind, MD. Electronically Signed: Soijett Blue, ED Scribe. 07/14/2015. 11:45 AM.  I personally performed the services described in this documentation, which was SCRIBED in my presence. The recorded information has been reviewed and considered accurate. It has been edited as necessary during review. Jonnie Kind, MD

## 2015-07-17 ENCOUNTER — Ambulatory Visit: Payer: Medicaid Other | Admitting: Obstetrics and Gynecology

## 2015-07-31 ENCOUNTER — Encounter: Payer: Self-pay | Admitting: Obstetrics and Gynecology

## 2015-08-08 ENCOUNTER — Other Ambulatory Visit: Payer: Self-pay | Admitting: Obstetrics and Gynecology

## 2015-08-08 DIAGNOSIS — Z30019 Encounter for initial prescription of contraceptives, unspecified: Secondary | ICD-10-CM

## 2015-08-08 MED ORDER — NORGESTIMATE-ETH ESTRADIOL 0.25-35 MG-MCG PO TABS: 1.0000 | ORAL_TABLET | Freq: Every day | ORAL | Status: DC

## 2015-08-09 ENCOUNTER — Telehealth: Payer: Self-pay | Admitting: *Deleted

## 2015-08-14 ENCOUNTER — Telehealth: Payer: Self-pay | Admitting: *Deleted

## 2015-08-14 NOTE — Telephone Encounter (Signed)
Duplicate message, documented telephone call on 08/14/2015.

## 2015-08-14 NOTE — Telephone Encounter (Signed)
Pt informed not to take the Sprintec OCP if she is still breastfeeding/pumping, Micronor is the only oral birth control safe while breastfeeding. Pt verbalized understanding and stating would call office back if she needed refill on the Micronor. Pt also stated she had not started the Spintec that was prescribed on 08/08/2015.

## 2015-08-21 ENCOUNTER — Encounter: Payer: Self-pay | Admitting: Obstetrics and Gynecology

## 2015-10-10 ENCOUNTER — Other Ambulatory Visit (HOSPITAL_COMMUNITY): Payer: Self-pay | Admitting: Registered Nurse

## 2015-10-10 ENCOUNTER — Ambulatory Visit (HOSPITAL_COMMUNITY)
Admission: RE | Admit: 2015-10-10 | Discharge: 2015-10-10 | Disposition: A | Discharge status: Home / Self Care | Payer: Medicaid Other | Source: Ambulatory Visit | Attending: Registered Nurse | Admitting: Registered Nurse

## 2015-10-10 DIAGNOSIS — M79644 Pain in right finger(s): Secondary | ICD-10-CM

## 2015-11-12 ENCOUNTER — Other Ambulatory Visit: Payer: Self-pay | Admitting: Adult Health

## 2016-01-30 ENCOUNTER — Ambulatory Visit (INDEPENDENT_AMBULATORY_CARE_PROVIDER_SITE_OTHER): Payer: Medicaid Other | Admitting: Obstetrics & Gynecology

## 2016-01-30 ENCOUNTER — Encounter: Payer: Self-pay | Admitting: Obstetrics & Gynecology

## 2016-01-30 VITALS — BP 120/88 | HR 78 | Ht 73.0 in | Wt 282.0 lb

## 2016-01-30 DIAGNOSIS — A499 Bacterial infection, unspecified: Secondary | ICD-10-CM | POA: Diagnosis not present

## 2016-01-30 DIAGNOSIS — R3915 Urgency of urination: Secondary | ICD-10-CM

## 2016-01-30 DIAGNOSIS — R350 Frequency of micturition: Secondary | ICD-10-CM | POA: Diagnosis not present

## 2016-01-30 DIAGNOSIS — B9689 Other specified bacterial agents as the cause of diseases classified elsewhere: Secondary | ICD-10-CM

## 2016-01-30 DIAGNOSIS — N898 Other specified noninflammatory disorders of vagina: Secondary | ICD-10-CM

## 2016-01-30 DIAGNOSIS — N760 Acute vaginitis: Secondary | ICD-10-CM

## 2016-01-30 LAB — POCT URINALYSIS DIPSTICK
Glucose, UA: NEGATIVE
KETONES UA: NEGATIVE
LEUKOCYTES UA: NEGATIVE
Nitrite, UA: NEGATIVE
RBC UA: 3

## 2016-01-30 MED ORDER — METRONIDAZOLE 0.75 % VA GEL: VAGINAL | 0 refills | Status: DC

## 2016-01-30 NOTE — Progress Notes (Signed)
Chief Complaint  Patient presents with  . vaginal discharge/itching, urinary frequency with little out    Blood pressure 120/88, pulse 78, height 6\' 1"  (1.854 m), weight 282 lb (127.9 kg), last menstrual period 01/15/2016, currently breastfeeding.  35 y.o. G2P1011 Patient's last menstrual period was 01/15/2016. The current method of family planning is OCP (estrogen/progesterone).  Outpatient Encounter Prescriptions as of 01/30/2016  Medication Sig  . citalopram (CELEXA) 20 MG tablet TAKE ONE TABLET BY MOUTH ONCE DAILY  . metroNIDAZOLE (METROGEL VAGINAL) 0.75 % vaginal gel Nightly x 5 nights  . [DISCONTINUED] docusate sodium (COLACE) 100 MG capsule Take 1 capsule (100 mg total) by mouth 2 (two) times daily. (Patient not taking: Reported on 01/30/2016)  . [DISCONTINUED] Glucosamine HCl (GLUCOSAMINE PO) Take 1 tablet by mouth daily.  . [DISCONTINUED] ibuprofen (ADVIL,MOTRIN) 600 MG tablet Take 1 tablet (600 mg total) by mouth every 6 (six) hours as needed. (Patient not taking: Reported on 01/30/2016)  . [DISCONTINUED] loratadine (CLARITIN) 10 MG tablet Take 10 mg by mouth daily.  . [DISCONTINUED] norgestimate-ethinyl estradiol (ORTHO-CYCLEN,SPRINTEC,PREVIFEM) 0.25-35 MG-MCG tablet Take 1 tablet by mouth daily. (Patient not taking: Reported on 01/30/2016)  . [DISCONTINUED] Omega-3 Fatty Acids (FISH OIL PO) Take 1 capsule by mouth daily.  . [DISCONTINUED] Prenatal Multivit-Min-Fe-FA (PRENATAL VITAMINS PO) Take 1 tablet by mouth daily.   No facility-administered encounter medications on file as of 01/30/2016.     Subjective Pt with complaint of urinary frequency urgency vagina irritation some discharge with slight odor Has history of multiple UTIs treated in the past with macrobid and bactrim ds  Objective Vulva:  normal appearing vulva with no masses, tenderness or lesions Vagina:  normal mucosa, thin grey discharge Cervix:  no cervical motion tenderness and no lesions Uterus:     Adnexa: ovaries:,     Pertinent ROS  No burning with urination,  No nausea, vomiting or diarrhea Nor fever chills or other constitutional symptoms   Labs or studies Reviewed all of her previous urine cultures which have all been negative except one,  06/2014 E coli    Impression Diagnoses this Encounter::   ICD-9-CM ICD-10-CM   1. BV (bacterial vaginosis) 616.10 N76.0    041.9 A49.9    MetroGel qhs x 5  2. Urinary frequency 788.41 R35.0 Urine culture     POCT urinalysis dipstick  3. Urinary urgency 788.63 R39.15     Established relevant diagnosis(es):   Plan/Recommendations: Meds ordered this encounter  Medications  . metroNIDAZOLE (METROGEL VAGINAL) 0.75 % vaginal gel    Sig: Nightly x 5 nights    Dispense:  70 g    Refill:  0    Labs or Scans Ordered: Orders Placed This Encounter  Procedures  . Urine culture  . POCT urinalysis dipstick    Management:: >metroGel for BV >take azostandard ATC for 2 weeks to see how her symptoms are >consider IC for this patient >urine is cultured  Follow up Return in about 2 weeks (around 02/13/2016) for Follow up, with Dr Elonda Husky.     All questions were answered.  Past Medical History:  Diagnosis Date  . Breast nodule 10/19/2013   Will get [place]  . Contraceptive management 10/19/2013  . Mental disorder    moody  . Obesity   . UTI (lower urinary tract infection) 07/05/2014  . Vaginal Pap smear, abnormal     Past Surgical History:  Procedure Laterality Date  . CERVICAL BIOPSY    . CHOLECYSTECTOMY  04/2013  OB History    Gravida Para Term Preterm AB Living   2 1 1   1 1    SAB TAB Ectopic Multiple Live Births     1   0 1      No Known Allergies  Social History   Social History  . Marital status: Married    Spouse name: N/A  . Number of children: N/A  . Years of education: N/A   Social History Main Topics  . Smoking status: Former Smoker    Types: Cigarettes    Quit date: 10/17/2014  . Smokeless  tobacco: Never Used  . Alcohol use No     Comment: occ  . Drug use: No  . Sexual activity: Not Currently    Birth control/ protection: None   Other Topics Concern  . None   Social History Narrative  . None    Family History  Problem Relation Age of Onset  . Cancer Maternal Grandfather     lung  . Alcohol abuse Maternal Grandfather   . Alcohol abuse Maternal Grandmother     recovering alcoholic  . Alcohol abuse Other     mom's side of family

## 2016-02-01 LAB — URINE CULTURE

## 2016-02-05 ENCOUNTER — Telehealth: Payer: Self-pay | Admitting: *Deleted

## 2016-02-05 NOTE — Telephone Encounter (Signed)
Pt called for urine culture results from 01/30/2016, urine culture result - Mixed urogenital flora. Pt states she is taking AZO as Dr.Eure recommended and pt states symptoms have improved. Please advise of any further recommendations.

## 2016-02-06 NOTE — Telephone Encounter (Signed)
Spoke with patient regarding message from Dr Elonda Husky who stated to keep her appointment as recommended. She was also made aware he sent her a direct message through my chart regarding her results however she had not received them yet but will look for his message again. No further questions.

## 2016-02-06 NOTE — Telephone Encounter (Signed)
Keep her appointment as I recommended I sent her a direct message through my chart regarding her results. Did she not get it?

## 2016-02-15 ENCOUNTER — Encounter: Payer: Self-pay | Admitting: Obstetrics & Gynecology

## 2016-02-15 ENCOUNTER — Ambulatory Visit (INDEPENDENT_AMBULATORY_CARE_PROVIDER_SITE_OTHER): Payer: Medicaid Other | Admitting: Obstetrics & Gynecology

## 2016-02-15 VITALS — BP 120/80 | HR 84 | Wt 278.0 lb

## 2016-02-15 DIAGNOSIS — N3010 Interstitial cystitis (chronic) without hematuria: Secondary | ICD-10-CM

## 2016-02-15 DIAGNOSIS — B9689 Other specified bacterial agents as the cause of diseases classified elsewhere: Secondary | ICD-10-CM

## 2016-02-15 DIAGNOSIS — N760 Acute vaginitis: Secondary | ICD-10-CM

## 2016-02-15 MED ORDER — METRONIDAZOLE 0.75 % VA GEL: VAGINAL | 2 refills | Status: DC

## 2016-02-15 NOTE — Progress Notes (Signed)
Chief Complaint  Patient presents with  . Follow-up    bacterial vaginosis    Blood pressure 120/80, pulse 84, weight 278 lb (126.1 kg), last menstrual period 02/10/2016, currently breastfeeding.  35 y.o. G2P1011 Patient's last menstrual period was 02/10/2016. The current method of family planning is OCP (estrogen/progesterone).  Outpatient Encounter Prescriptions as of 02/15/2016  Medication Sig  . citalopram (CELEXA) 20 MG tablet TAKE ONE TABLET BY MOUTH ONCE DAILY  . metroNIDAZOLE (METROGEL VAGINAL) 0.75 % vaginal gel Nightly x 10  . [DISCONTINUED] metroNIDAZOLE (METROGEL VAGINAL) 0.75 % vaginal gel Nightly x 5 nights   No facility-administered encounter medications on file as of 02/15/2016.     Subjective Pt diagnosed with BV at last visit, used metro Gel has not had sex Pt thinks has cleared up  Also urine culture was negative and improved on the azostandard, suspect pt has interstitial cystitis Pt has read regarding diet triggers and eants to try to manage it that way   Objective Wet Prep:   A sample of vaginal discharge was obtained from the posterior fornix using a cotton swab. 2 drops of saline were placed on a slide and the cotton swab was immersed in the saline. Microscopic evaluation was performed and results were as follows:  Negative  for yeast  Positive for clue cells , consistent with Bacterial vaginosis Negative for trichomonas  Normal WBC population   Whiff test: Positive   Pertinent ROS No burning with urination, frequency or urgency No nausea, vomiting or diarrhea Nor fever chills or other constitutional symptoms   Labs or studies     Impression Diagnoses this Encounter::   ICD-9-CM ICD-10-CM   1. BV (bacterial vaginosis) 616.10 N76.0    041.9 B96.89    repeat course of metro gel  2. Chronic interstitial cystitis 595.1 N30.10    diet+zinc(pumpkin seed oil)+tumeric    Established relevant  diagnosis(es):   Plan/Recommendations: Meds ordered this encounter  Medications  . metroNIDAZOLE (METROGEL VAGINAL) 0.75 % vaginal gel    Sig: Nightly x 10    Dispense:  70 g    Refill:  2    Labs or Scans Ordered: No orders of the defined types were placed in this encounter.   Management:: Metro gel x 5  Follow up Return if symptoms worsen or fail to improve.     All questions were answered.  Past Medical History:  Diagnosis Date  . Breast nodule 10/19/2013   Will get [place]  . Contraceptive management 10/19/2013  . Mental disorder    moody  . Obesity   . UTI (lower urinary tract infection) 07/05/2014  . Vaginal Pap smear, abnormal     Past Surgical History:  Procedure Laterality Date  . CERVICAL BIOPSY    . CHOLECYSTECTOMY  04/2013    OB History    Gravida Para Term Preterm AB Living   2 1 1   1 1    SAB TAB Ectopic Multiple Live Births     1   0 1      No Known Allergies  Social History   Social History  . Marital status: Married    Spouse name: N/A  . Number of children: N/A  . Years of education: N/A   Social History Main Topics  . Smoking status: Former Smoker    Types: Cigarettes    Quit date: 10/17/2014  . Smokeless tobacco: Never Used  . Alcohol use No     Comment: occ  .  Drug use: No  . Sexual activity: Not Currently    Birth control/ protection: None   Other Topics Concern  . None   Social History Narrative  . None    Family History  Problem Relation Age of Onset  . Cancer Maternal Grandfather     lung  . Alcohol abuse Maternal Grandfather   . Alcohol abuse Maternal Grandmother     recovering alcoholic  . Alcohol abuse Other     mom's side of family

## 2016-04-18 ENCOUNTER — Other Ambulatory Visit: Payer: Self-pay | Admitting: Adult Health

## 2016-09-05 ENCOUNTER — Other Ambulatory Visit: Payer: Self-pay | Admitting: Adult Health

## 2016-10-08 ENCOUNTER — Other Ambulatory Visit: Payer: Self-pay | Admitting: Adult Health

## 2016-11-29 ENCOUNTER — Other Ambulatory Visit: Payer: Self-pay | Admitting: Women's Health

## 2016-12-02 ENCOUNTER — Other Ambulatory Visit: Payer: Medicaid Other | Admitting: Obstetrics and Gynecology

## 2016-12-06 ENCOUNTER — Ambulatory Visit (INDEPENDENT_AMBULATORY_CARE_PROVIDER_SITE_OTHER): Payer: Medicaid Other | Admitting: Adult Health

## 2016-12-06 ENCOUNTER — Encounter: Payer: Self-pay | Admitting: Adult Health

## 2016-12-06 VITALS — BP 122/74 | HR 82 | Ht 73.0 in | Wt 277.0 lb

## 2016-12-06 DIAGNOSIS — Z3201 Encounter for pregnancy test, result positive: Secondary | ICD-10-CM

## 2016-12-06 DIAGNOSIS — Z3490 Encounter for supervision of normal pregnancy, unspecified, unspecified trimester: Secondary | ICD-10-CM

## 2016-12-06 DIAGNOSIS — N926 Irregular menstruation, unspecified: Secondary | ICD-10-CM

## 2016-12-06 DIAGNOSIS — O3680X0 Pregnancy with inconclusive fetal viability, not applicable or unspecified: Secondary | ICD-10-CM

## 2016-12-06 LAB — POCT URINE PREGNANCY: Preg Test, Ur: POSITIVE — AB

## 2016-12-06 MED ORDER — PRENATAL PLUS 27-1 MG PO TABS: 1.0000 | ORAL_TABLET | Freq: Every day | ORAL | 12 refills | Status: DC

## 2016-12-06 NOTE — Patient Instructions (Signed)
First Trimester of Pregnancy The first trimester of pregnancy is from week 1 until the end of week 13 (months 1 through 3). A week after a sperm fertilizes an egg, the egg will implant on the wall of the uterus. This embryo will begin to develop into a baby. Genes from you and your partner will form the baby. The female genes will determine whether the baby will be a boy or a girl. At 6-8 weeks, the eyes and face will be formed, and the heartbeat can be seen on ultrasound. At the end of 12 weeks, all the baby's organs will be formed. Now that you are pregnant, you will want to do everything you can to have a healthy baby. Two of the most important things are to get good prenatal care and to follow your health care provider's instructions. Prenatal care is all the medical care you receive before the baby's birth. This care will help prevent, find, and treat any problems during the pregnancy and childbirth. Body changes during your first trimester Your body goes through many changes during pregnancy. The changes vary from woman to woman.  You may gain or lose a couple of pounds at first.  You may feel sick to your stomach (nauseous) and you may throw up (vomit). If the vomiting is uncontrollable, call your health care provider.  You may tire easily.  You may develop headaches that can be relieved by medicines. All medicines should be approved by your health care provider.  You may urinate more often. Painful urination may mean you have a bladder infection.  You may develop heartburn as a result of your pregnancy.  You may develop constipation because certain hormones are causing the muscles that push stool through your intestines to slow down.  You may develop hemorrhoids or swollen veins (varicose veins).  Your breasts may begin to grow larger and become tender. Your nipples may stick out more, and the tissue that surrounds them (areola) may become darker.  Your gums may bleed and may be  sensitive to brushing and flossing.  Dark spots or blotches (chloasma, mask of pregnancy) may develop on your face. This will likely fade after the baby is born.  Your menstrual periods will stop.  You may have a loss of appetite.  You may develop cravings for certain kinds of food.  You may have changes in your emotions from day to day, such as being excited to be pregnant or being concerned that something may go wrong with the pregnancy and baby.  You may have more vivid and strange dreams.  You may have changes in your hair. These can include thickening of your hair, rapid growth, and changes in texture. Some women also have hair loss during or after pregnancy, or hair that feels dry or thin. Your hair will most likely return to normal after your baby is born.  What to expect at prenatal visits During a routine prenatal visit:  You will be weighed to make sure you and the baby are growing normally.  Your blood pressure will be taken.  Your abdomen will be measured to track your baby's growth.  The fetal heartbeat will be listened to between weeks 10 and 14 of your pregnancy.  Test results from any previous visits will be discussed.  Your health care provider may ask you:  How you are feeling.  If you are feeling the baby move.  If you have had any abnormal symptoms, such as leaking fluid, bleeding, severe headaches,   or abdominal cramping.  If you are using any tobacco products, including cigarettes, chewing tobacco, and electronic cigarettes.  If you have any questions.  Other tests that may be performed during your first trimester include:  Blood tests to find your blood type and to check for the presence of any previous infections. The tests will also be used to check for low iron levels (anemia) and protein on red blood cells (Rh antibodies). Depending on your risk factors, or if you previously had diabetes during pregnancy, you may have tests to check for high blood  sugar that affects pregnant women (gestational diabetes).  Urine tests to check for infections, diabetes, or protein in the urine.  An ultrasound to confirm the proper growth and development of the baby.  Fetal screens for spinal cord problems (spina bifida) and Down syndrome.  HIV (human immunodeficiency virus) testing. Routine prenatal testing includes screening for HIV, unless you choose not to have this test.  You may need other tests to make sure you and the baby are doing well.  Follow these instructions at home: Medicines  Follow your health care provider's instructions regarding medicine use. Specific medicines may be either safe or unsafe to take during pregnancy.  Take a prenatal vitamin that contains at least 600 micrograms (mcg) of folic acid.  If you develop constipation, try taking a stool softener if your health care provider approves. Eating and drinking  Eat a balanced diet that includes fresh fruits and vegetables, whole grains, good sources of protein such as meat, eggs, or tofu, and low-fat dairy. Your health care provider will help you determine the amount of weight gain that is right for you.  Avoid raw meat and uncooked cheese. These carry germs that can cause birth defects in the baby.  Eating four or five small meals rather than three large meals a day may help relieve nausea and vomiting. If you start to feel nauseous, eating a few soda crackers can be helpful. Drinking liquids between meals, instead of during meals, also seems to help ease nausea and vomiting.  Limit foods that are high in fat and processed sugars, such as fried and sweet foods.  To prevent constipation: ? Eat foods that are high in fiber, such as fresh fruits and vegetables, whole grains, and beans. ? Drink enough fluid to keep your urine clear or pale yellow. Activity  Exercise only as directed by your health care provider. Most women can continue their usual exercise routine during  pregnancy. Try to exercise for 30 minutes at least 5 days a week. Exercising will help you: ? Control your weight. ? Stay in shape. ? Be prepared for labor and delivery.  Experiencing pain or cramping in the lower abdomen or lower back is a good sign that you should stop exercising. Check with your health care provider before continuing with normal exercises.  Try to avoid standing for long periods of time. Move your legs often if you must stand in one place for a long time.  Avoid heavy lifting.  Wear low-heeled shoes and practice good posture.  You may continue to have sex unless your health care provider tells you not to. Relieving pain and discomfort  Wear a good support bra to relieve breast tenderness.  Take warm sitz baths to soothe any pain or discomfort caused by hemorrhoids. Use hemorrhoid cream if your health care provider approves.  Rest with your legs elevated if you have leg cramps or low back pain.  If you develop   varicose veins in your legs, wear support hose. Elevate your feet for 15 minutes, 3-4 times a day. Limit salt in your diet. Prenatal care  Schedule your prenatal visits by the twelfth week of pregnancy. They are usually scheduled monthly at first, then more often in the last 2 months before delivery.  Write down your questions. Take them to your prenatal visits.  Keep all your prenatal visits as told by your health care provider. This is important. Safety  Wear your seat belt at all times when driving.  Make a list of emergency phone numbers, including numbers for family, friends, the hospital, and police and fire departments. General instructions  Ask your health care provider for a referral to a local prenatal education class. Begin classes no later than the beginning of month 6 of your pregnancy.  Ask for help if you have counseling or nutritional needs during pregnancy. Your health care provider can offer advice or refer you to specialists for help  with various needs.  Do not use hot tubs, steam rooms, or saunas.  Do not douche or use tampons or scented sanitary pads.  Do not cross your legs for long periods of time.  Avoid cat litter boxes and soil used by cats. These carry germs that can cause birth defects in the baby and possibly loss of the fetus by miscarriage or stillbirth.  Avoid all smoking, herbs, alcohol, and medicines not prescribed by your health care provider. Chemicals in these products affect the formation and growth of the baby.  Do not use any products that contain nicotine or tobacco, such as cigarettes and e-cigarettes. If you need help quitting, ask your health care provider. You may receive counseling support and other resources to help you quit.  Schedule a dentist appointment. At home, brush your teeth with a soft toothbrush and be gentle when you floss. Contact a health care provider if:  You have dizziness.  You have mild pelvic cramps, pelvic pressure, or nagging pain in the abdominal area.  You have persistent nausea, vomiting, or diarrhea.  You have a bad smelling vaginal discharge.  You have pain when you urinate.  You notice increased swelling in your face, hands, legs, or ankles.  You are exposed to fifth disease or chickenpox.  You are exposed to German measles (rubella) and have never had it. Get help right away if:  You have a fever.  You are leaking fluid from your vagina.  You have spotting or bleeding from your vagina.  You have severe abdominal cramping or pain.  You have rapid weight gain or loss.  You vomit blood or material that looks like coffee grounds.  You develop a severe headache.  You have shortness of breath.  You have any kind of trauma, such as from a fall or a car accident. Summary  The first trimester of pregnancy is from week 1 until the end of week 13 (months 1 through 3).  Your body goes through many changes during pregnancy. The changes vary from  woman to woman.  You will have routine prenatal visits. During those visits, your health care provider will examine you, discuss any test results you may have, and talk with you about how you are feeling. This information is not intended to replace advice given to you by your health care provider. Make sure you discuss any questions you have with your health care provider. Document Released: 04/23/2001 Document Revised: 04/10/2016 Document Reviewed: 04/10/2016 Elsevier Interactive Patient Education  2017 [employer]   Inc[employer]  

## 2016-12-06 NOTE — Progress Notes (Signed)
Subjective:     Patient ID: Sue Nguyen, female   DOB: 18-Feb-1981, 36 y.o.   MRN: [id number]  HPI Sue Nguyen is a 36 year old black female, in for UPT, has missed a period and has some heartburn, taking Tums, but not great.She stopped Celexa about 1 week ago, due to running out of them, no problems, and wanted to not take during pregnancy.She has 30 month old at home.   Review of Systems +missed period Some heartburn Reviewed past medical,surgical, social and family history. Reviewed medications and allergies.     Objective:   Physical Exam BP 122/74 (BP Location: Left Arm, Patient Position: Sitting, Cuff Size: Large)   Pulse 82   Ht 6\' 1"  (1.854 m)   Wt 277 lb (125.6 kg)   LMP 10/23/2016 (Approximate)   Breastfeeding? No   BMI 36.55 kg/m UPT+, abnout 6+2 weeks by LMP with EDD 07/30/17, Skin warm and dry. Neck: mid line trachea, normal thyroid, good ROM, no lymphadenopathy noted. Lungs: clear to ausculation bilaterally. Cardiovascular: regular rate and rhythm.Abdomen is soft and non tender.PHQ 2 score 0, will not resume celexa, discussed with Dr Elonda Husky.     Assessment:     1. Pregnancy examination or test, positive result   2. Pregnancy, unspecified gestational age   61. Encounter to determine fetal viability of pregnancy, single or unspecified fetus       Plan:     Meds ordered this encounter  Medications  . prenatal vitamin w/FE, FA (PRENATAL 1 + 1) 27-1 MG TABS tablet    Sig: Take 1 tablet by mouth daily at 12 noon.    Dispense:  30 each    Refill:  12    Order Specific Question:   Supervising Provider    Answer:   Florian Buff [[id number]]  Return in 1 week for dating [place] Review handout by [facility]

## 2016-12-13 ENCOUNTER — Ambulatory Visit (INDEPENDENT_AMBULATORY_CARE_PROVIDER_SITE_OTHER): Payer: Medicaid Other | Admitting: Obstetrics and Gynecology

## 2016-12-13 ENCOUNTER — Ambulatory Visit (INDEPENDENT_AMBULATORY_CARE_PROVIDER_SITE_OTHER): Payer: Medicaid Other

## 2016-12-13 ENCOUNTER — Encounter: Payer: Self-pay | Admitting: Obstetrics and Gynecology

## 2016-12-13 VITALS — BP 118/80 | HR 80 | Wt 274.0 lb

## 2016-12-13 DIAGNOSIS — Z1389 Encounter for screening for other disorder: Secondary | ICD-10-CM | POA: Diagnosis not present

## 2016-12-13 DIAGNOSIS — O3680X0 Pregnancy with inconclusive fetal viability, not applicable or unspecified: Secondary | ICD-10-CM | POA: Diagnosis not present

## 2016-12-13 DIAGNOSIS — K5902 Outlet dysfunction constipation: Secondary | ICD-10-CM | POA: Diagnosis not present

## 2016-12-13 DIAGNOSIS — Z331 Pregnant state, incidental: Secondary | ICD-10-CM | POA: Diagnosis not present

## 2016-12-13 DIAGNOSIS — Z3A01 Less than 8 weeks gestation of pregnancy: Secondary | ICD-10-CM | POA: Diagnosis not present

## 2016-12-13 LAB — POCT URINALYSIS DIPSTICK
Glucose, UA: NEGATIVE
KETONES UA: NEGATIVE
LEUKOCYTES UA: NEGATIVE
Nitrite, UA: NEGATIVE
PROTEIN UA: NEGATIVE

## 2016-12-13 NOTE — Progress Notes (Signed)
   [facility] Visit  @DATE @            Patient name: Sue Nguyen MRN [id number]  Date of birth: December 09, 1980  CC & HPI:  Sue Nguyen is a 36 y.o. female presenting today for hemorrhoids and reports being constipated. She notes she hasn't tried anything at home for her discomfort. Pt denies any other sx at this time.Episodes of constipation predated start of this pregnancy. 2 days ago the patient was carefully uncomfortable with difficulty with defecation  ROS:  ROS +constipation +possible hemorrhoids  All systems are negative except as noted in the HPI and PMH.    Pertinent History Reviewed:   Reviewed: Significant for  Medical         Past Medical History:  Diagnosis Date  . Breast nodule 10/19/2013   Will get [place]  . Contraceptive management 10/19/2013  . Mental disorder    moody  . Obesity   . UTI (lower urinary tract infection) 07/05/2014  . Vaginal Pap smear, abnormal                               Surgical Hx:    Past Surgical History:  Procedure Laterality Date  . CERVICAL BIOPSY    . CHOLECYSTECTOMY  04/2013   Medications: Reviewed & Updated - see associated section                       Current Outpatient Prescriptions:  .  prenatal vitamin w/FE, FA (PRENATAL 1 + 1) 27-1 MG TABS tablet, Take 1 tablet by mouth daily at 12 noon., Disp: 30 each, Rfl: 12   Social History: Reviewed -  reports that she quit smoking about 2 years ago. Her smoking use included Cigarettes. She has never used smokeless tobacco.  Objective Findings:  Vitals: Blood pressure 118/80, pulse 80, weight 274 lb (124.3 kg), last menstrual period 10/23/2016, not currently breastfeeding.  Physical Examination: General appearance - alert, well appearing, and in no distress and oriented to person, place, and time Mental status - alert, oriented to person, place, and time, normal mood, behavior, speech, dress, motor activity, and thought processes Pelvic - normal external genitalia Rectal - obvious  constipation. No external hemorrhoid on rectal exam. No internal hemorrhoids.    Assessment & Plan:   A:  1. Severe constipation in early pregnancy  2. Small subchorionic hemorrhage 3. Intrauterine pregnancy 5-6 weeks P:  1. Anusol HC Rx 2. Use fleet or soap suds enemas PRN    By signing my name below, I, Margit Banda, attest that this documentation has been prepared under the direction and in the presence of Jonnie Kind, MD. Electronically Signed: Margit Banda, Medical Scribe. 12/13/16. 1:08 PM.  I personally performed the services described in this documentation, which was SCRIBED in my presence. The recorded information has been reviewed and considered accurate. It has been edited as necessary during review. Jonnie Kind, MD

## 2016-12-13 NOTE — Progress Notes (Signed)
[place] 6+3 wks,single IUP w/ys,pos fht 118 bpm,subchorionic hemorrhage 1.3 x 1.2 x .4 cm,normal ovaries bilat,crl 6.3 mm,EDD 08/05/2017

## 2016-12-26 ENCOUNTER — Encounter: Payer: Medicaid Other | Admitting: Advanced Practice Midwife

## 2017-01-07 ENCOUNTER — Ambulatory Visit (INDEPENDENT_AMBULATORY_CARE_PROVIDER_SITE_OTHER): Payer: Medicaid Other | Admitting: Advanced Practice Midwife

## 2017-01-07 ENCOUNTER — Ambulatory Visit: Payer: Medicaid Other | Admitting: *Deleted

## 2017-01-07 DIAGNOSIS — Z3490 Encounter for supervision of normal pregnancy, unspecified, unspecified trimester: Secondary | ICD-10-CM | POA: Insufficient documentation

## 2017-01-07 NOTE — Progress Notes (Signed)
error 

## 2017-01-24 ENCOUNTER — Other Ambulatory Visit (HOSPITAL_COMMUNITY)
Admission: RE | Admit: 2017-01-24 | Discharge: 2017-01-24 | Disposition: A | Discharge status: Home / Self Care | Payer: Medicaid Other | Source: Ambulatory Visit | Attending: Advanced Practice Midwife | Admitting: Advanced Practice Midwife

## 2017-01-24 ENCOUNTER — Ambulatory Visit: Payer: Medicaid Other | Admitting: *Deleted

## 2017-01-24 ENCOUNTER — Encounter: Payer: Self-pay | Admitting: Women's Health

## 2017-01-24 ENCOUNTER — Ambulatory Visit (INDEPENDENT_AMBULATORY_CARE_PROVIDER_SITE_OTHER): Payer: Medicaid Other | Admitting: Women's Health

## 2017-01-24 VITALS — BP 112/64 | HR 80 | Wt 267.0 lb

## 2017-01-24 DIAGNOSIS — O09299 Supervision of pregnancy with other poor reproductive or obstetric history, unspecified trimester: Secondary | ICD-10-CM

## 2017-01-24 DIAGNOSIS — Z3A12 12 weeks gestation of pregnancy: Secondary | ICD-10-CM | POA: Insufficient documentation

## 2017-01-24 DIAGNOSIS — Z1389 Encounter for screening for other disorder: Secondary | ICD-10-CM | POA: Diagnosis not present

## 2017-01-24 DIAGNOSIS — O09291 Supervision of pregnancy with other poor reproductive or obstetric history, first trimester: Secondary | ICD-10-CM | POA: Diagnosis not present

## 2017-01-24 DIAGNOSIS — Z331 Pregnant state, incidental: Secondary | ICD-10-CM | POA: Diagnosis not present

## 2017-01-24 DIAGNOSIS — Z124 Encounter for screening for malignant neoplasm of cervix: Secondary | ICD-10-CM

## 2017-01-24 DIAGNOSIS — Z3481 Encounter for supervision of other normal pregnancy, first trimester: Secondary | ICD-10-CM | POA: Diagnosis not present

## 2017-01-24 LAB — POCT URINALYSIS DIPSTICK
GLUCOSE UA: NEGATIVE
Ketones, UA: NEGATIVE
Leukocytes, UA: NEGATIVE
Nitrite, UA: NEGATIVE
Protein, UA: NEGATIVE

## 2017-01-24 MED ORDER — LIDOCAINE HCL 2 % EX GEL: CUTANEOUS | 3 refills | Status: DC

## 2017-01-24 NOTE — Patient Instructions (Addendum)
Nausea & Vomiting  Have saltine crackers or pretzels by your bed and eat a few bites before you raise your head out of bed in the morning  Eat small frequent meals throughout the day instead of large meals  Drink plenty of fluids throughout the day to stay hydrated, just don't drink a lot of fluids with your meals.  This can make your stomach fill up faster making you feel sick  Do not brush your teeth right after you eat  Products with real ginger are good for nausea, like ginger ale and ginger hard candy Make sure it says made with real ginger!  Sucking on sour candy like lemon heads is also good for nausea  If your prenatal vitamins make you nauseated, take them at night so you will sleep through the nausea  Sea Bands  If you feel like you need medicine for the nausea & vomiting please let us know  If you are unable to keep any fluids or food down please let us know   Constipation  Drink plenty of fluid, preferably water, throughout the day  Eat foods high in fiber such as fruits, vegetables, and grains  Exercise, such as walking, is a good way to keep your bowels regular  Drink warm fluids, especially warm prune juice, or decaf coffee  Eat a 1/2 cup of real oatmeal (not instant), 1/2 cup applesauce, and 1/2-1 cup warm prune juice every day  If needed, you may take Colace (docusate sodium) stool softener once or twice a day to help keep the stool soft. If you are pregnant, wait until you are out of your first trimester (12-14 weeks of pregnancy)  If you still are having problems with constipation, you may take Miralax once daily as needed to help keep your bowels regular.  If you are pregnant, wait until you are out of your first trimester (12-14 weeks of pregnancy)   First Trimester of Pregnancy The first trimester of pregnancy is from week 1 until the end of week 12 (months 1 through 3). A week after a sperm fertilizes an egg, the egg will implant on the wall of the  uterus. This embryo will begin to develop into a baby. Genes from you and your partner are forming the baby. The female genes determine whether the baby is a boy or a girl. At 6-8 weeks, the eyes and face are formed, and the heartbeat can be seen on ultrasound. At the end of 12 weeks, all the baby's organs are formed.  Now that you are pregnant, you will want to do everything you can to have a healthy baby. Two of the most important things are to get good prenatal care and to follow your health care provider's instructions. Prenatal care is all the medical care you receive before the baby's birth. This care will help prevent, find, and treat any problems during the pregnancy and childbirth. BODY CHANGES Your body goes through many changes during pregnancy. The changes vary from woman to woman.   You may gain or lose a couple of pounds at first.  You may feel sick to your stomach (nauseous) and throw up (vomit). If the vomiting is uncontrollable, call your health care provider.  You may tire easily.  You may develop headaches that can be relieved by medicines approved by your health care provider.  You may urinate more often. Painful urination may mean you have a bladder infection.  You may develop heartburn as a result of your pregnancy.    You may develop constipation because certain hormones are causing the muscles that push waste through your intestines to slow down.  You may develop hemorrhoids or swollen, bulging veins (varicose veins).  Your breasts may begin to grow larger and become tender. Your nipples may stick out more, and the tissue that surrounds them (areola) may become darker.  Your gums may bleed and may be sensitive to brushing and flossing.  Dark spots or blotches (chloasma, mask of pregnancy) may develop on your face. This will likely fade after the baby is born.  Your menstrual periods will stop.  You may have a loss of appetite.  You may develop cravings for certain  kinds of food.  You may have changes in your emotions from day to day, such as being excited to be pregnant or being concerned that something may go wrong with the pregnancy and baby.  You may have more vivid and strange dreams.  You may have changes in your hair. These can include thickening of your hair, rapid growth, and changes in texture. Some women also have hair loss during or after pregnancy, or hair that feels dry or thin. Your hair will most likely return to normal after your baby is born. WHAT TO EXPECT AT YOUR PRENATAL VISITS During a routine prenatal visit:  You will be weighed to make sure you and the baby are growing normally.  Your blood pressure will be taken.  Your abdomen will be measured to track your baby's growth.  The fetal heartbeat will be listened to starting around week 10 or 12 of your pregnancy.  Test results from any previous visits will be discussed. Your health care provider may ask you:  How you are feeling.  If you are feeling the baby move.  If you have had any abnormal symptoms, such as leaking fluid, bleeding, severe headaches, or abdominal cramping.  If you have any questions. Other tests that may be performed during your first trimester include:  Blood tests to find your blood type and to check for the presence of any previous infections. They will also be used to check for low iron levels (anemia) and Rh antibodies. Later in the pregnancy, blood tests for diabetes will be done along with other tests if problems develop.  Urine tests to check for infections, diabetes, or protein in the urine.  An ultrasound to confirm the proper growth and development of the baby.  An amniocentesis to check for possible genetic problems.  Fetal screens for spina bifida and Down syndrome.  You may need other tests to make sure you and the baby are doing well. HOME CARE INSTRUCTIONS  Medicines  Follow your health care provider's instructions regarding  medicine use. Specific medicines may be either safe or unsafe to take during pregnancy.  Take your prenatal vitamins as directed.  If you develop constipation, try taking a stool softener if your health care provider approves. Diet  Eat regular, well-balanced meals. Choose a variety of foods, such as meat or vegetable-based protein, fish, milk and low-fat dairy products, vegetables, fruits, and whole grain breads and cereals. Your health care provider will help you determine the amount of weight gain that is right for you.  Avoid raw meat and uncooked cheese. These carry germs that can cause birth defects in the baby.  Eating four or five small meals rather than three large meals a day may help relieve nausea and vomiting. If you start to feel nauseous, eating a few soda crackers can be   helpful. Drinking liquids between meals instead of during meals also seems to help nausea and vomiting.  If you develop constipation, eat more high-fiber foods, such as fresh vegetables or fruit and whole grains. Drink enough fluids to keep your urine clear or pale yellow. Activity and Exercise  Exercise only as directed by your health care provider. Exercising will help you:  Control your weight.  Stay in shape.  Be prepared for labor and delivery.  Experiencing pain or cramping in the lower abdomen or low back is a good sign that you should stop exercising. Check with your health care provider before continuing normal exercises.  Try to avoid standing for long periods of time. Move your legs often if you must stand in one place for a long time.  Avoid heavy lifting.  Wear low-heeled shoes, and practice good posture.  You may continue to have sex unless your health care provider directs you otherwise. Relief of Pain or Discomfort  Wear a good support bra for breast tenderness.   Take warm sitz baths to soothe any pain or discomfort caused by hemorrhoids. Use hemorrhoid cream if your health care  provider approves.   Rest with your legs elevated if you have leg cramps or low back pain.  If you develop varicose veins in your legs, wear support hose. Elevate your feet for 15 minutes, 3-4 times a day. Limit salt in your diet. Prenatal Care  Schedule your prenatal visits by the twelfth week of pregnancy. They are usually scheduled monthly at first, then more often in the last 2 months before delivery.  Write down your questions. Take them to your prenatal visits.  Keep all your prenatal visits as directed by your health care provider. Safety  Wear your seat belt at all times when driving.  Make a list of emergency phone numbers, including numbers for family, friends, the hospital, and police and fire departments. General Tips  Ask your health care provider for a referral to a local prenatal education class. Begin classes no later than at the beginning of month 6 of your pregnancy.  Ask for help if you have counseling or nutritional needs during pregnancy. Your health care provider can offer advice or refer you to specialists for help with various needs.  Do not use hot tubs, steam rooms, or saunas.  Do not douche or use tampons or scented sanitary pads.  Do not cross your legs for long periods of time.  Avoid cat litter boxes and soil used by cats. These carry germs that can cause birth defects in the baby and possibly loss of the fetus by miscarriage or stillbirth.  Avoid all smoking, herbs, alcohol, and medicines not prescribed by your health care provider. Chemicals in these affect the formation and growth of the baby.  Schedule a dentist appointment. At home, brush your teeth with a soft toothbrush and be gentle when you floss. SEEK MEDICAL CARE IF:   You have dizziness.  You have mild pelvic cramps, pelvic pressure, or nagging pain in the abdominal area.  You have persistent nausea, vomiting, or diarrhea.  You have a bad smelling vaginal discharge.  You have pain  with urination.  You notice increased swelling in your face, hands, legs, or ankles. SEEK IMMEDIATE MEDICAL CARE IF:   You have a fever.  You are leaking fluid from your vagina.  You have spotting or bleeding from your vagina.  You have severe abdominal cramping or pain.  You have rapid weight gain or loss.    You vomit blood or material that looks like coffee grounds.  You are exposed to [place] measles and have never had them.  You are exposed to fifth disease or chickenpox.  You develop a severe headache.  You have shortness of breath.  You have any kind of trauma, such as from a fall or a car accident. Document Released: 04/23/2001 Document Revised: 09/13/2013 Document Reviewed: 03/09/2013 [facility] Patient Information 2015 [place], [place]. This information is not intended to replace advice given to you by your health care provider. Make sure you discuss any questions you have with your health care provider.   Anal Fissure, Adult An anal fissure is a small tear or crack in the skin around the anus. Bleeding from a fissure usually stops on its own within a few minutes. However, bleeding will often occur again with each bowel movement until the crack heals. What are the causes? This condition may be caused by:  Passing large, hard stool (feces).  Frequent diarrhea.  Constipation.  Inflammatory bowel disease (Crohn disease or ulcerative colitis).  Infections.  Anal sex.  What are the signs or symptoms? Symptoms of this condition include:  Bleeding from the rectum.  Small amounts of blood seen on your stool, on toilet paper, or in the toilet after a bowel movement.  Painful bowel movements.  Itching or irritation around the anus.  How is this diagnosed? A health care provider may diagnose this condition by closely examining the anal area. An anal fissure can usually be seen with careful inspection. In some cases, a rectal exam may be performed, or a short tube  (anoscope) may be used to examine the anal canal. How is this treated? Treatment for this condition may include:  Taking steps to avoid constipation. This may include making changes to your diet, such as increasing your intake of fiber or fluid.  Taking fiber supplements. These supplements can soften your stool to help make bowel movements easier. Your health care provider may also prescribe a stool softener if your stool is often hard.  Taking sitz baths. This may help to heal the tear.  Using medicated creams or ointments. These may be prescribed to lessen discomfort.  Follow these instructions at home: Eating and drinking  Avoid foods that may be constipating, such as bananas and dairy products.  Drink enough fluid to keep your urine clear or pale yellow.  Maintain a diet that is high in fruits, whole grains, and vegetables. General instructions  Keep the anal area as clean and dry as possible.  Take sitz baths as told by your health care provider. Do not use soap in the sitz baths.  Take over-the-counter and prescription medicines only as told by your health care provider.  Use creams or ointments only as told by your health care provider.  Keep all follow-up visits as told by your health care provider. This is important. Contact a health care provider if:  You have more bleeding.  You have a fever.  You have diarrhea that is mixed with blood.  You continue to have pain.  Your problem is getting worse rather than better. This information is not intended to replace advice given to you by your health care provider. Make sure you discuss any questions you have with your health care provider. Document Released: 04/29/2005 Document Revised: 09/06/2015 Document Reviewed: 07/25/2014 Elsevier Interactive Patient Education  [employer].

## 2017-01-24 NOTE — Progress Notes (Signed)
Subjective:  Sue Nguyen is a 36 y.o. G6P1011 African American female at [redacted]w[redacted]d by 6wk u/s, being seen today for her first obstetrical visit.  Her obstetrical history is significant for 34min shoulder dystocia w/ 7lb 1oz baby, multiple maneuvers- finally resolved w/ delivery of posterior arm, had 3rd degree lac- states she is still having problems w/ bm's. Is on stool softener/miralax, bm's are soft, but still very painful, feels like she is ripping, and has brb w/ each bm.  History of depression, no meds in awhile, doing well. Pregnancy history fully reviewed.  Patient reports only complaints are as listed above. Denies vb, cramping, uti s/s, abnormal/malodorous vag d/c, or vulvovaginal itching/irritation.  BP 112/64   Pulse 80   Wt 267 lb (121.1 kg)   LMP 10/23/2016 (Approximate)   BMI 35.23 kg/m   HISTORY: OB History  Gravida Para Term Preterm AB Living  3 1 1   1 1   SAB TAB Ectopic Multiple Live Births    1   0 1    # Outcome Date GA Lbr Len/2nd Weight Sex Delivery Anes PTL Lv  3 Current           2 Term 05/30/15 [redacted]w[redacted]d / 00:19 7 lb 1.6 oz (3.22 kg) F Vag-Spont EPI N LIV     Birth Comments: WNL  1 TAB 2003             Past Medical History:  Diagnosis Date  . Breast nodule 10/19/2013   Will get [place]  . Contraceptive management 10/19/2013  . Mental disorder    moody  . Obesity   . UTI (lower urinary tract infection) 07/05/2014  . Vaginal Pap smear, abnormal    Past Surgical History:  Procedure Laterality Date  . CERVICAL BIOPSY    . CHOLECYSTECTOMY  04/2013   Family History  Problem Relation Age of Onset  . Cancer Maternal Grandfather        lung  . Alcohol abuse Maternal Grandfather   . Alcohol abuse Maternal Grandmother        recovering alcoholic  . Dementia Maternal Grandmother   . Hypertension Maternal Grandmother   . Diabetes Paternal Grandfather   . Cancer Paternal Grandfather   . Hypertension Paternal Grandmother   . Hyperlipidemia Paternal Grandmother   .  Thyroid disease Mother   . Alcohol abuse Other        mom's side of family  . Diabetes Maternal Uncle   . Hypertension Maternal Uncle   . Other Maternal Uncle        spinal problems in wheel chair    Exam   System:     General: Well developed & nourished, no acute distress   Skin: Warm & dry, normal coloration and turgor, no rashes   Neurologic: Alert & oriented, normal mood   Cardiovascular: Regular rate & rhythm   Respiratory: Effort & rate normal, LCTAB, acyanotic   Abdomen: Soft, non tender   Extremities: normal strength, tone   Pelvic Exam:    Perineum: Normal perineum   Vulva: Normal, no lesions   Vagina:  Normal mucosa, normal discharge   Cervix: Normal, bulbous, appears closed   Uterus: Normal size/shape/contour for GA  Rectal exam: good tone, unable to feel definite ridge that would indicate anal fissure, but sx are consistent w/ such  Thin prep pap smear obtained w/  high risk HPV cotesting FHR: 155 via doppler   Assessment:   Pregnancy: [id number] Patient Active Problem List  Diagnosis Date Noted  . Supervision of normal pregnancy 01/07/2017  . NSVD (normal spontaneous vaginal delivery) 05/30/2015  . Obstetrical laceration, third degree 05/30/2015  . Breast nodule 10/19/2013  . Moody 10/16/2012    [redacted]w[redacted]d G3P1011 New OB visit H/O shoulder dystocia w/ 3rd degree lac Depression Presumed anal fissure  Plan:  Initial labs obtained Continue prenatal vitamins Problem list reviewed and updated Reviewed n/v relief measures and warning s/s to report Reviewed recommended weight gain based on pre-gravid BMI Encouraged well-balanced diet Genetic Screening discussed all genetic screening: declined Cystic fibrosis screening discussed declined Ultrasound discussed; fetal survey: requested Follow up in 4 weeks for visit Oval completed Rx lidocaine jelly to apply to anal area 10-51min prior to BM, verified w/ [facility] this was safe for this GA pregnancy Gave printed  list of constipation relief measures- to continue stool softener/miralax Will get EFW ~37wks d/t h/o shoulder dystocia  Tawnya Crook CNM, [facility] 01/24/2017 11:41 AM

## 2017-01-26 LAB — URINE CULTURE

## 2017-01-26 LAB — MED LIST OPTION NOT SELECTED

## 2017-01-27 LAB — PMP SCREEN PROFILE (10S), URINE
Amphetamine Scrn, Ur: NEGATIVE ng/mL
BARBITURATE SCREEN URINE: NEGATIVE ng/mL
BENZODIAZEPINE SCREEN, URINE: NEGATIVE ng/mL
CANNABINOIDS UR QL SCN: NEGATIVE ng/mL
COCAINE(METAB.)SCREEN, URINE: NEGATIVE ng/mL
Creatinine(Crt), U: 190.7 mg/dL (ref 20.0–300.0)
METHADONE SCREEN, URINE: NEGATIVE ng/mL
OPIATE SCREEN URINE: NEGATIVE ng/mL
OXYCODONE+OXYMORPHONE UR QL SCN: NEGATIVE ng/mL
PH UR, DRUG SCRN: 5.6 (ref 4.5–8.9)
Phencyclidine Qn, Ur: NEGATIVE ng/mL
Propoxyphene Scrn, Ur: NEGATIVE ng/mL

## 2017-01-29 LAB — CYTOLOGY - PAP
Chlamydia: NEGATIVE
Diagnosis: NEGATIVE
HPV: NOT DETECTED
Neisseria Gonorrhea: NEGATIVE

## 2017-01-29 NOTE — Progress Notes (Signed)
Encounter opened in error. Pt rescheduled appt

## 2017-01-31 IMAGING — DX DG HAND COMPLETE 3+V*R*
3 series · 3 of 3 positions shown · non-contrast
Comparison: No recent prior.

CLINICAL DATA: Pain.  No known injury.  Initial evaluation .

EXAM:
RIGHT HAND - COMPLETE 3+ VIEW

[hand pa]
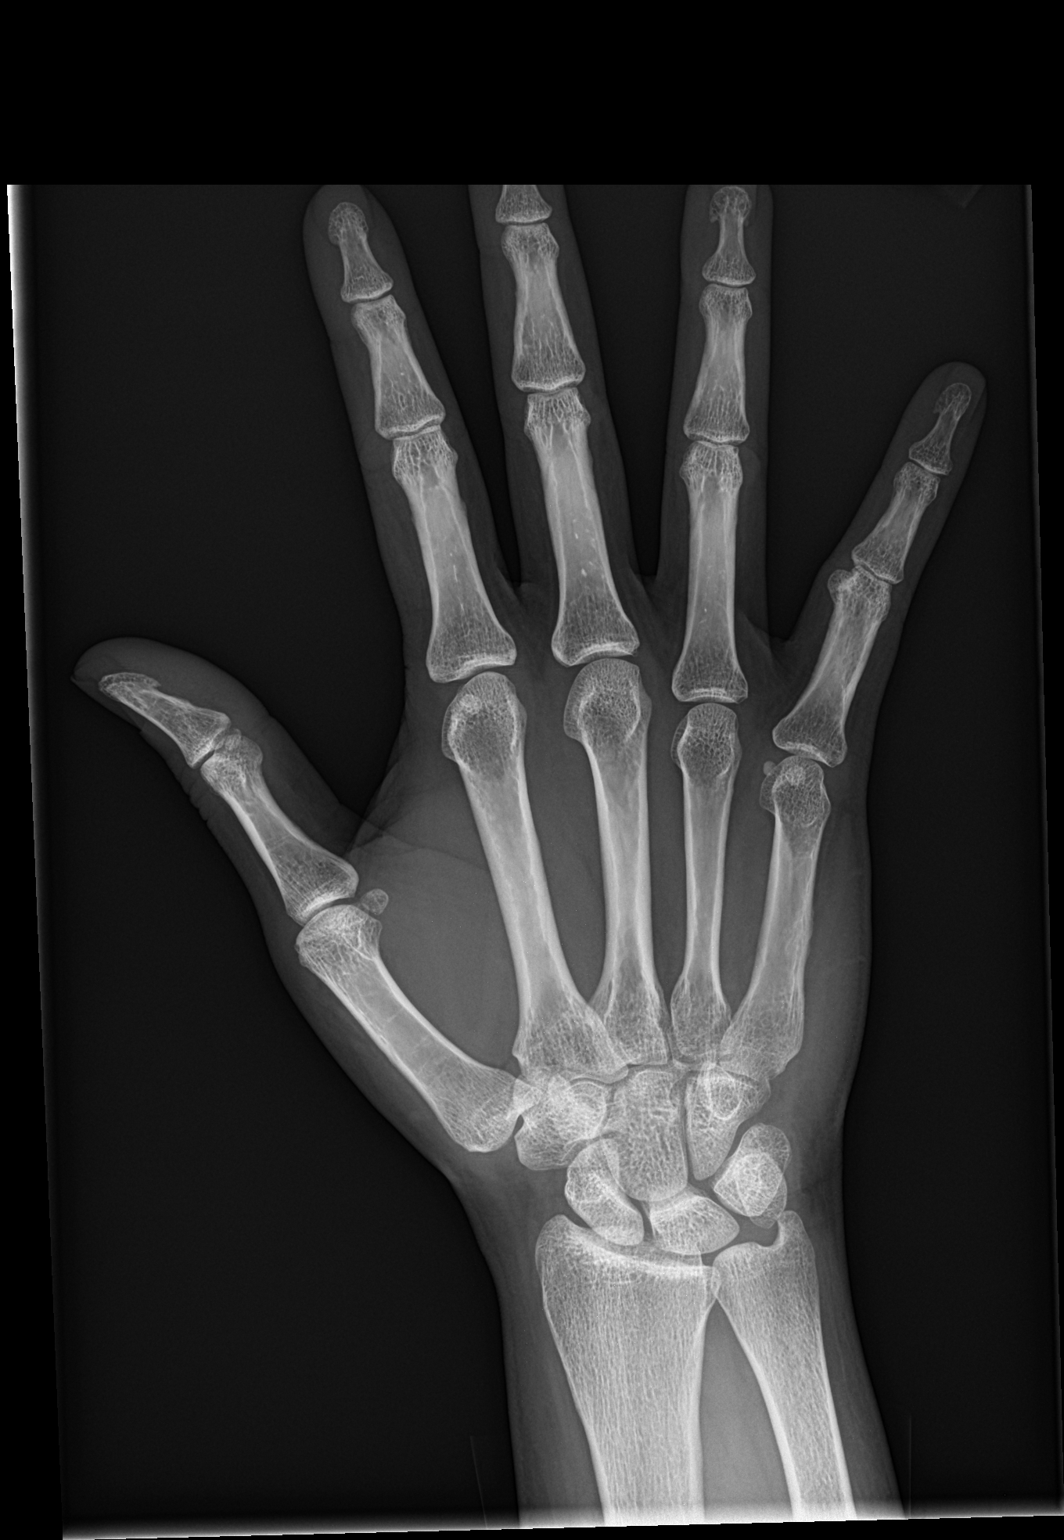

[hand obl]
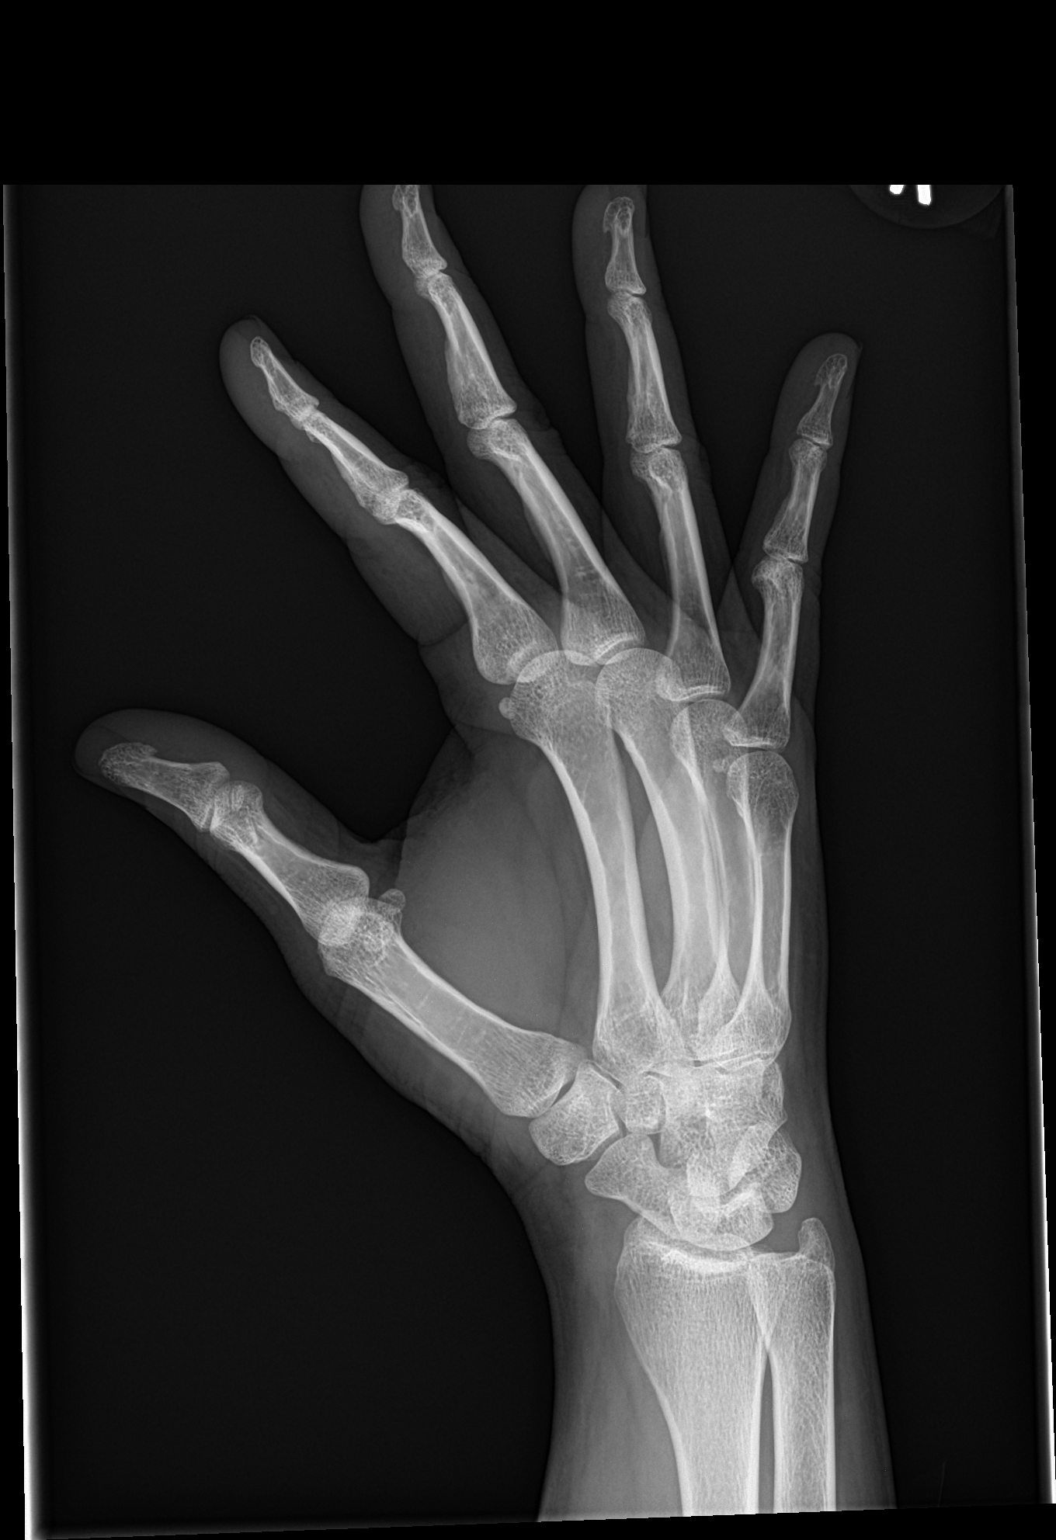

[hand lat]
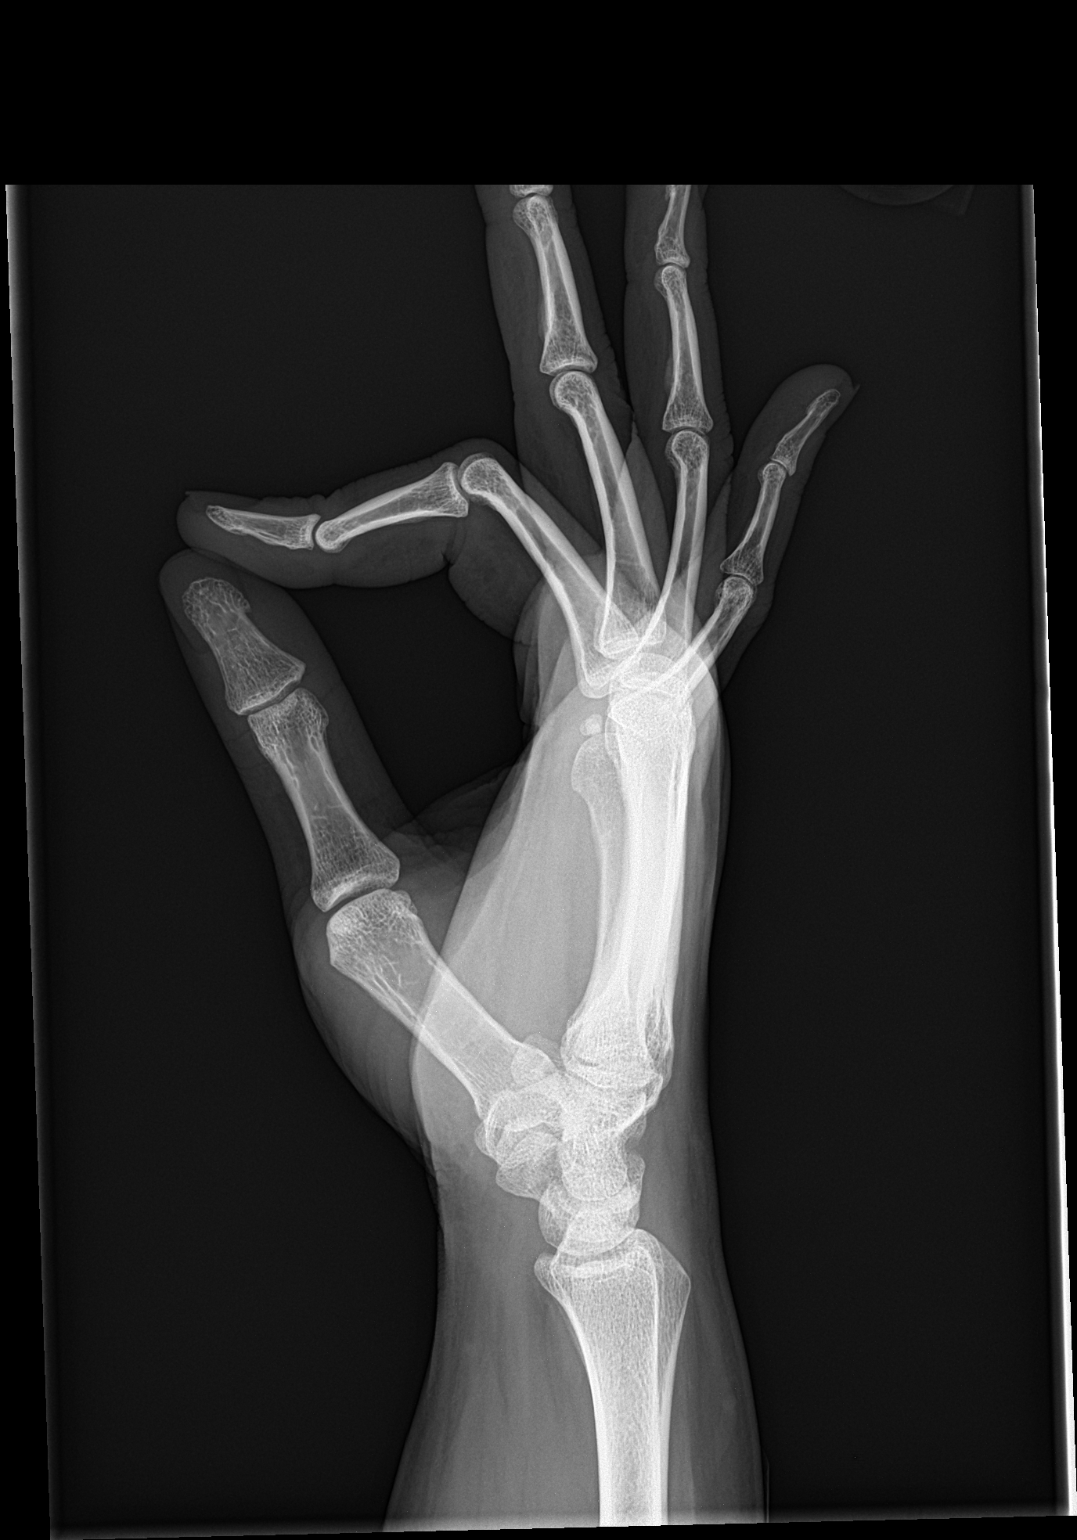

[3 of 3 positions shown; findings below may reference images not displayed]

FINDINGS: No acute bony or joint abnormality identified. No evidence of
fracture or dislocation. Mild degenerative changes first metacarpal
phalangeal joint.
IMPRESSION: Negative.

## 2017-02-21 ENCOUNTER — Encounter: Payer: Medicaid Other | Admitting: Obstetrics and Gynecology

## 2017-02-25 ENCOUNTER — Encounter: Payer: Medicaid Other | Admitting: Women's Health

## 2017-10-28 ENCOUNTER — Encounter (HOSPITAL_COMMUNITY): Payer: Self-pay

## 2019-10-26 ENCOUNTER — Encounter: Payer: Self-pay | Admitting: Nurse Practitioner

## 2019-12-30 ENCOUNTER — Ambulatory Visit: Payer: Medicaid Other | Admitting: Nurse Practitioner

## 2020-02-15 NOTE — Progress Notes (Deleted)
Referring Provider: Ginger Organ Primary Care Physician:  Ginger Organ Primary Gastroenterologist:  Dr. Rayne Du chief complaint on file.   HPI:   Sue Nguyen is a 39 y.o. female presenting today at the request of Ginger Organ for abdominal pain.  Ultrasound abdomen complete 10/23/2019: Sequela of cholecystectomy, otherwise unremarkable exam.    Past Medical History:  Diagnosis Date   Breast nodule 10/19/2013   Will get [place]   Contraceptive management 10/19/2013   Mental disorder    moody   Obesity    UTI (lower urinary tract infection) 07/05/2014   Vaginal Pap smear, abnormal     Past Surgical History:  Procedure Laterality Date   CERVICAL BIOPSY     CHOLECYSTECTOMY  04/2013    Current Outpatient Medications  Medication Sig Dispense Refill   lidocaine (XYLOCAINE) 2 % jelly Small amount rectally 10-15 minutes prior to each bowel movement 30 mL 3   prenatal vitamin w/FE, FA (PRENATAL 1 + 1) 27-1 MG TABS tablet Take 1 tablet by mouth daily at 12 noon. 30 each 12   No current facility-administered medications for this visit.    Allergies as of 02/16/2020   (No Known Allergies)    Family History  Problem Relation Age of Onset   Cancer Maternal Grandfather        lung   Alcohol abuse Maternal Grandfather    Alcohol abuse Maternal Grandmother        recovering alcoholic   Dementia Maternal Grandmother    Hypertension Maternal Grandmother    Diabetes Paternal Grandfather    Cancer Paternal Grandfather    Hypertension Paternal Grandmother    Hyperlipidemia Paternal Grandmother    Thyroid disease Mother    Alcohol abuse Other        mom's side of family   Diabetes Maternal Uncle    Hypertension Maternal Uncle    Other Maternal Uncle        spinal problems in wheel chair    Social History   Socioeconomic History   Marital status: Married    Spouse name: Not on file   Number of children: Not on file    Years of education: Not on file   Highest education level: Not on file  Occupational History   Not on file  Tobacco Use   Smoking status: Former Smoker    Types: Cigarettes    Quit date: 10/17/2014    Years since quitting: 5.3   Smokeless tobacco: Never Used  Substance and Sexual Activity   Alcohol use: No   Drug use: No   Sexual activity: Yes    Birth control/protection: None  Other Topics Concern   Not on file  Social History Narrative   Not on file   Social Determinants of Health   Financial Resource Strain:    Difficulty of Paying Living Expenses: Not on file  Food Insecurity:    Worried About Charity fundraiser in the Last Year: Not on file   [employer] of Food in the Last Year: Not on file  Transportation Needs:    Lack of Transportation (Medical): Not on file   Lack of Transportation (Non-Medical): Not on file  Physical Activity:    Days of Exercise per Week: Not on file   Minutes of Exercise per Session: Not on file  Stress:    Feeling of Stress : Not on file  Social Connections:    Frequency of Communication with  Friends and Family: Not on file   Frequency of Social Gatherings with Friends and Family: Not on file   Attends Religious Services: Not on file   Active Member of Clubs or Organizations: Not on file   Attends Archivist Meetings: Not on file   Marital Status: Not on file  Intimate Partner Violence:    Fear of Current or Ex-Partner: Not on file   Emotionally Abused: Not on file   Physically Abused: Not on file   Sexually Abused: Not on file    Review of Systems: Gen: Denies any fever, chills, fatigue, weight loss, lack of appetite.  CV: Denies chest pain, heart palpitations, peripheral edema, syncope.  Resp: Denies shortness of breath at rest or with exertion. Denies wheezing or cough.  GI: Denies dysphagia or odynophagia. Denies jaundice, hematemesis, fecal incontinence. GU : Denies urinary burning, urinary  frequency, urinary hesitancy MS: Denies joint pain, muscle weakness, cramps, or limitation of movement.  Derm: Denies rash, itching, dry skin Psych: Denies depression, anxiety, memory loss, and confusion Heme: Denies bruising, bleeding, and enlarged lymph nodes.  Physical Exam: There were no vitals taken for this visit. General:   Alert and oriented. Pleasant and cooperative. Well-nourished and well-developed.  Head:  Normocephalic and atraumatic. Eyes:  Without icterus, sclera clear and conjunctiva pink.  Ears:  Normal auditory acuity. Nose:  No deformity, discharge,  or lesions. Mouth:  No deformity or lesions, oral mucosa pink.  Neck:  Supple, without mass or thyromegaly. Lungs:  Clear to auscultation bilaterally. No wheezes, rales, or rhonchi. No distress.  Heart:  S1, S2 present without murmurs appreciated.  Abdomen:  +BS, soft, non-tender and non-distended. No HSM noted. No guarding or rebound. No masses appreciated.  Rectal:  Deferred  Msk:  Symmetrical without gross deformities. Normal posture. Pulses:  Normal pulses noted. Extremities:  Without clubbing or edema. Neurologic:  Alert and  oriented x4;  grossly normal neurologically. Skin:  Intact without significant lesions or rashes. Cervical Nodes:  No significant cervical adenopathy. Psych:  Alert and cooperative. Normal mood and affect.

## 2020-02-16 ENCOUNTER — Ambulatory Visit: Payer: Medicaid Other | Admitting: Gastroenterology

## 2020-02-17 ENCOUNTER — Encounter: Payer: Self-pay | Admitting: *Deleted

## 2020-02-17 ENCOUNTER — Ambulatory Visit (INDEPENDENT_AMBULATORY_CARE_PROVIDER_SITE_OTHER): Payer: Medicaid Other | Admitting: Internal Medicine

## 2020-02-17 ENCOUNTER — Other Ambulatory Visit: Payer: Self-pay

## 2020-02-17 ENCOUNTER — Encounter: Payer: Self-pay | Admitting: Internal Medicine

## 2020-02-17 VITALS — BP 122/78 | HR 76 | Temp 97.0°F | Ht 71.0 in | Wt 237.2 lb

## 2020-02-17 DIAGNOSIS — R140 Abdominal distension (gaseous): Secondary | ICD-10-CM | POA: Diagnosis not present

## 2020-02-17 DIAGNOSIS — R197 Diarrhea, unspecified: Secondary | ICD-10-CM

## 2020-02-17 DIAGNOSIS — R1012 Left upper quadrant pain: Secondary | ICD-10-CM

## 2020-02-17 NOTE — Progress Notes (Signed)
Primary Care Physician:  Sue Nguyen Primary Gastroenterologist:  Dr. Abbey Chatters  Chief Complaint  Patient presents with  . Abdominal Pain    across lower abd, noticed more when she ate steak.   Sue Nguyen    w/ eating certain foods, gas that can be painful  . Diarrhea    after certain foods. Gallbladder removed 2015    HPI:   Sue Nguyen is a 39 y.o. female who presents referral from her PCP Sue Nguyen for evaluation.  She has multiple GI complaints for me today.  She states over the last few months she has had progressively worsening abdominal pain.  Pain is lower left and right quadrants.  Does not radiate.  Pain is intermittent.  Also has epigastric pain at times as well.  Notes bloating all the time worse after meals.  States her pain initially happened when she ate steak but now it seems to occur even though she has eliminated steak from her diet.  No melena hematochezia.  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  No previous endoscopy.  No family history of colorectal malignancy.  No family history of inflammatory bowel disease.  No mucus in her stool.  No heartburn or reflux.  No dysphagia or odynophagia.  No unintentional weight loss.  Past Medical History:  Diagnosis Date  . Breast nodule 10/19/2013   Will get [place]  . Contraceptive management 10/19/2013  . Mental disorder    moody  . Obesity   . UTI (lower urinary tract infection) 07/05/2014  . Vaginal Pap smear, abnormal     Past Surgical History:  Procedure Laterality Date  . CERVICAL BIOPSY    . CHOLECYSTECTOMY  04/2013    No current outpatient medications on file.   No current facility-administered medications for this visit.    Allergies as of 02/17/2020  . (No Known Allergies)    Family History  Problem Relation Age of Onset  . Cancer Maternal Grandfather        lung  . Alcohol abuse Maternal Grandfather   . Alcohol abuse Maternal Grandmother        recovering alcoholic   . Dementia Maternal Grandmother   . Hypertension Maternal Grandmother   . Diabetes Paternal Grandfather   . Cancer Paternal Grandfather   . Hypertension Paternal Grandmother   . Hyperlipidemia Paternal Grandmother   . Thyroid disease Mother   . Alcohol abuse Other        mom's side of family  . Diabetes Maternal Uncle   . Hypertension Maternal Uncle   . Other Maternal Uncle        spinal problems in wheel chair    Social History   Socioeconomic History  . Marital status: Married    Spouse name: Not on file  . Number of children: Not on file  . Years of education: Not on file  . Highest education level: Not on file  Occupational History  . Not on file  Tobacco Use  . Smoking status: Former Smoker    Types: Cigarettes    Quit date: 10/17/2014    Years since quitting: 5.3  . Smokeless tobacco: Never Used  Substance and Sexual Activity  . Alcohol use: No  . Drug use: No  . Sexual activity: Yes    Birth control/protection: None  Other Topics Concern  . Not on file  Social History Narrative  . Not on file   Social Determinants of Health  Financial Resource Strain:   . Difficulty of Paying Living Expenses: Not on file  Food Insecurity:   . Worried About Charity fundraiser in the Last Year: Not on file  . Ran Out of Food in the Last Year: Not on file  Transportation Needs:   . Lack of Transportation (Medical): Not on file  . Lack of Transportation (Non-Medical): Not on file  Physical Activity:   . Days of Exercise per Week: Not on file  . Minutes of Exercise per Session: Not on file  Stress:   . Feeling of Stress : Not on file  Social Connections:   . Frequency of Communication with Friends and Family: Not on file  . Frequency of Social Gatherings with Friends and Family: Not on file  . Attends Religious Services: Not on file  . Active Member of Clubs or Organizations: Not on file  . Attends Archivist Meetings: Not on file  . Marital Status: Not on  file  Intimate Partner Violence:   . Fear of Current or Ex-Partner: Not on file  . Emotionally Abused: Not on file  . Physically Abused: Not on file  . Sexually Abused: Not on file    Subjective: Review of Systems  Constitutional: Negative for chills and fever.  HENT: Negative for congestion and hearing loss.   Eyes: Negative for blurred vision and double vision.  Respiratory: Negative for cough and shortness of breath.   Cardiovascular: Negative for chest pain and palpitations.  Gastrointestinal: Positive for abdominal pain and diarrhea. Negative for blood in stool, constipation, heartburn, melena and vomiting.       Bloating  Genitourinary: Negative for dysuria and urgency.  Musculoskeletal: Negative for joint pain and myalgias.  Skin: Negative for itching and rash.  Neurological: Negative for dizziness and headaches.  Psychiatric/Behavioral: Negative for depression. The patient is not nervous/anxious.        Objective: BP 122/78   Pulse 76   Temp (!) 97 F (36.1 C) (Oral)   Ht 5\' 11"  (1.803 m)   Wt 237 lb 3.2 oz (107.6 kg)   LMP 02/14/2020   BMI 33.08 kg/m  Physical Exam Constitutional:      Appearance: Normal appearance.  HENT:     Head: Normocephalic and atraumatic.  Eyes:     Extraocular Movements: Extraocular movements intact.     Conjunctiva/sclera: Conjunctivae normal.  Cardiovascular:     Rate and Rhythm: Normal rate and regular rhythm.  Pulmonary:     Effort: Pulmonary effort is normal.     Breath sounds: Normal breath sounds.  Abdominal:     General: Bowel sounds are normal.     Palpations: Abdomen is soft.  Musculoskeletal:        General: No swelling. Normal range of motion.     Cervical back: Normal range of motion and neck supple.  Skin:    General: Skin is warm and dry.     Coloration: Skin is not jaundiced.  Neurological:     General: No focal deficit present.     Mental Status: She is alert and oriented to person, place, and time.   Psychiatric:        Mood and Affect: Mood normal.        Behavior: Behavior normal.      Assessment: *Abdominal pain-lower and epigastric *Diarrhea *Bloating  Plan: Etiology of patient's symptoms unclear.  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   That being said, we will  schedule for EGD to evaluate for peptic ulcer disease, esophagitis, gastritis, H. Pylori, duodenitis, or other. Will also evaluate for esophageal stricture, Schatzki's ring, esophageal web or other.   At the same time we will perform colonoscopy to rule out underlying inflammatory bowel disease such as Crohn's disease or ulcerative colitis, as well as rule out microscopic colitis.  The risks including infection, bleed, or perforation as well as benefits, limitations, alternatives and imponderables have been reviewed with the patient. Potential for esophageal dilation, biopsy, etc. have also been reviewed.  Questions have been answered. All parties agreeable.  We will consider trialing on colestipol if endoscopic evaluation is unremarkable.  Thank you Sue Nguyen for the kind referral.  02/17/2020 3:06 PM   Disclaimer: This note was dictated with voice recognition software. Similar sounding words can inadvertently be transcribed and may not be corrected upon review.

## 2020-02-17 NOTE — Patient Instructions (Addendum)
We will schedule you for EGD and colonoscopy today in clinic to further evaluate your symptoms.  Further recommendations to follow.  At [facility] we value your feedback. You may receive a survey about your visit today. Please share your experience as we strive to create trusting relationships with our patients to provide genuine, compassionate, quality care.  We appreciate your understanding and patience as we review any laboratory studies, imaging, and other diagnostic tests that are ordered as we care for you. Our office policy is 5 business days for review of these results, and any emergent or urgent results are addressed in a timely manner for your best interest. If you do not hear from our office in 1 week, please contact us.   We also encourage the use of MyChart, which contains your medical information for your review as well. If you are not enrolled in this feature, an access code is on this after visit summary for your convenience. Thank you for allowing [place] to be involved in your care.  It was great to see you today!  I hope you have a great rest of your fall!!    Capucine Tryon K. Abbey Chatters, D.O. Gastroenterology and Hepatology [facility]

## 2020-02-17 NOTE — H&P (View-Only) (Signed)
Primary Care Physician:  Ginger Organ Primary Gastroenterologist:  Dr. Abbey Chatters  Chief Complaint  Patient presents with  . Abdominal Pain    across lower abd, noticed more when she ate steak.   [name]    w/ eating certain foods, gas that can be painful  . Diarrhea    after certain foods. Gallbladder removed 2015    HPI:   Sue Nguyen is a 39 y.o. female who presents referral from her PCP Collene Mares for evaluation.  She has multiple GI complaints for me today.  She states over the last few months she has had progressively worsening abdominal pain.  Pain is lower left and right quadrants.  Does not radiate.  Pain is intermittent.  Also has epigastric pain at times as well.  Notes bloating all the time worse after meals.  States her pain initially happened when she ate steak but now it seems to occur even though she has eliminated steak from her diet.  No melena hematochezia.  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  No previous endoscopy.  No family history of colorectal malignancy.  No family history of inflammatory bowel disease.  No mucus in her stool.  No heartburn or reflux.  No dysphagia or odynophagia.  No unintentional weight loss.  Past Medical History:  Diagnosis Date  . Breast nodule 10/19/2013   Will get [place]  . Contraceptive management 10/19/2013  . Mental disorder    moody  . Obesity   . UTI (lower urinary tract infection) 07/05/2014  . Vaginal Pap smear, abnormal     Past Surgical History:  Procedure Laterality Date  . CERVICAL BIOPSY    . CHOLECYSTECTOMY  04/2013    No current outpatient medications on file.   No current facility-administered medications for this visit.    Allergies as of 02/17/2020  . (No Known Allergies)    Family History  Problem Relation Age of Onset  . Cancer Maternal Grandfather        lung  . Alcohol abuse Maternal Grandfather   . Alcohol abuse Maternal Grandmother        recovering alcoholic   . Dementia Maternal Grandmother   . Hypertension Maternal Grandmother   . Diabetes Paternal Grandfather   . Cancer Paternal Grandfather   . Hypertension Paternal Grandmother   . Hyperlipidemia Paternal Grandmother   . Thyroid disease Mother   . Alcohol abuse Other        mom's side of family  . Diabetes Maternal Uncle   . Hypertension Maternal Uncle   . Other Maternal Uncle        spinal problems in wheel chair    Social History   Socioeconomic History  . Marital status: Married    Spouse name: Not on file  . Number of children: Not on file  . Years of education: Not on file  . Highest education level: Not on file  Occupational History  . Not on file  Tobacco Use  . Smoking status: Former Smoker    Types: Cigarettes    Quit date: 10/17/2014    Years since quitting: 5.3  . Smokeless tobacco: Never Used  Substance and Sexual Activity  . Alcohol use: No  . Drug use: No  . Sexual activity: Yes    Birth control/protection: None  Other Topics Concern  . Not on file  Social History Narrative  . Not on file   Social Determinants of Health  Financial Resource Strain:   . Difficulty of Paying Living Expenses: Not on file  Food Insecurity:   . Worried About Charity fundraiser in the Last Year: Not on file  . Ran Out of Food in the Last Year: Not on file  Transportation Needs:   . Lack of Transportation (Medical): Not on file  . Lack of Transportation (Non-Medical): Not on file  Physical Activity:   . Days of Exercise per Week: Not on file  . Minutes of Exercise per Session: Not on file  Stress:   . Feeling of Stress : Not on file  Social Connections:   . Frequency of Communication with Friends and Family: Not on file  . Frequency of Social Gatherings with Friends and Family: Not on file  . Attends Religious Services: Not on file  . Active Member of Clubs or Organizations: Not on file  . Attends Archivist Meetings: Not on file  . Marital Status: Not on  file  Intimate Partner Violence:   . Fear of Current or Ex-Partner: Not on file  . Emotionally Abused: Not on file  . Physically Abused: Not on file  . Sexually Abused: Not on file    Subjective: Review of Systems  Constitutional: Negative for chills and fever.  HENT: Negative for congestion and hearing loss.   Eyes: Negative for blurred vision and double vision.  Respiratory: Negative for cough and shortness of breath.   Cardiovascular: Negative for chest pain and palpitations.  Gastrointestinal: Positive for abdominal pain and diarrhea. Negative for blood in stool, constipation, heartburn, melena and vomiting.       Bloating  Genitourinary: Negative for dysuria and urgency.  Musculoskeletal: Negative for joint pain and myalgias.  Skin: Negative for itching and rash.  Neurological: Negative for dizziness and headaches.  Psychiatric/Behavioral: Negative for depression. The patient is not nervous/anxious.        Objective: BP 122/78   Pulse 76   Temp (!) 97 F (36.1 C) (Oral)   Ht 5\' 11"  (1.803 m)   Wt 237 lb 3.2 oz (107.6 kg)   LMP 02/14/2020   BMI 33.08 kg/m  Physical Exam Constitutional:      Appearance: Normal appearance.  HENT:     Head: Normocephalic and atraumatic.  Eyes:     Extraocular Movements: Extraocular movements intact.     Conjunctiva/sclera: Conjunctivae normal.  Cardiovascular:     Rate and Rhythm: Normal rate and regular rhythm.  Pulmonary:     Effort: Pulmonary effort is normal.     Breath sounds: Normal breath sounds.  Abdominal:     General: Bowel sounds are normal.     Palpations: Abdomen is soft.  Musculoskeletal:        General: No swelling. Normal range of motion.     Cervical back: Normal range of motion and neck supple.  Skin:    General: Skin is warm and dry.     Coloration: Skin is not jaundiced.  Neurological:     General: No focal deficit present.     Mental Status: She is alert and oriented to person, place, and time.    Psychiatric:        Mood and Affect: Mood normal.        Behavior: Behavior normal.      Assessment: *Abdominal pain-lower and epigastric *Diarrhea *Bloating  Plan: Etiology of patient's symptoms unclear.  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   That being said, we  will schedule for EGD to evaluate for peptic ulcer disease, esophagitis, gastritis, H. Pylori, duodenitis, or other. Will also evaluate for esophageal stricture, Schatzki's ring, esophageal web or other.   At the same time we will perform colonoscopy to rule out underlying inflammatory bowel disease such as Crohn's disease or ulcerative colitis, as well as rule out microscopic colitis.  The risks including infection, bleed, or perforation as well as benefits, limitations, alternatives and imponderables have been reviewed with the patient. Potential for esophageal dilation, biopsy, etc. have also been reviewed.  Questions have been answered. All parties agreeable.  We will consider trialing on colestipol if endoscopic evaluation is unremarkable.  Thank you Collene Mares for the kind referral.  02/17/2020 3:06 PM   Disclaimer: This note was dictated with voice recognition software. Similar sounding words can inadvertently be transcribed and may not be corrected upon review.

## 2020-03-02 ENCOUNTER — Other Ambulatory Visit: Payer: Self-pay

## 2020-03-02 ENCOUNTER — Other Ambulatory Visit (HOSPITAL_COMMUNITY)
Admission: RE | Admit: 2020-03-02 | Discharge: 2020-03-02 | Disposition: A | Discharge status: Home / Self Care | Payer: Medicaid Other | Source: Ambulatory Visit | Attending: Internal Medicine | Admitting: Internal Medicine

## 2020-03-02 DIAGNOSIS — Z01818 Encounter for other preprocedural examination: Secondary | ICD-10-CM | POA: Insufficient documentation

## 2020-03-02 DIAGNOSIS — Z20822 Contact with and (suspected) exposure to covid-19: Secondary | ICD-10-CM | POA: Insufficient documentation

## 2020-03-02 LAB — SARS CORONAVIRUS 2 (TAT 6-24 HRS): SARS Coronavirus 2: NEGATIVE

## 2020-03-02 LAB — PREGNANCY, URINE: Preg Test, Ur: NEGATIVE

## 2020-03-03 ENCOUNTER — Ambulatory Visit (HOSPITAL_COMMUNITY)
Admission: RE | Admit: 2020-03-03 | Discharge: 2020-03-03 | Disposition: A | Discharge status: Home / Self Care | Payer: Medicaid Other | Attending: Internal Medicine | Admitting: Internal Medicine

## 2020-03-03 ENCOUNTER — Encounter (HOSPITAL_COMMUNITY): Payer: Self-pay

## 2020-03-03 ENCOUNTER — Ambulatory Visit (HOSPITAL_COMMUNITY): Payer: Medicaid Other | Admitting: Certified Registered"

## 2020-03-03 ENCOUNTER — Other Ambulatory Visit: Payer: Self-pay

## 2020-03-03 ENCOUNTER — Encounter (HOSPITAL_COMMUNITY)
Admission: RE | Disposition: A | Discharge status: Home / Self Care | Payer: Self-pay | Source: Home / Self Care | Attending: Internal Medicine

## 2020-03-03 DIAGNOSIS — K633 Ulcer of intestine: Secondary | ICD-10-CM | POA: Diagnosis not present

## 2020-03-03 DIAGNOSIS — Z87891 Personal history of nicotine dependence: Secondary | ICD-10-CM | POA: Diagnosis not present

## 2020-03-03 DIAGNOSIS — K2970 Gastritis, unspecified, without bleeding: Secondary | ICD-10-CM | POA: Diagnosis not present

## 2020-03-03 DIAGNOSIS — K2980 Duodenitis without bleeding: Secondary | ICD-10-CM | POA: Diagnosis not present

## 2020-03-03 DIAGNOSIS — K2950 Unspecified chronic gastritis without bleeding: Secondary | ICD-10-CM | POA: Diagnosis not present

## 2020-03-03 DIAGNOSIS — K529 Noninfective gastroenteritis and colitis, unspecified: Secondary | ICD-10-CM | POA: Insufficient documentation

## 2020-03-03 DIAGNOSIS — R140 Abdominal distension (gaseous): Secondary | ICD-10-CM | POA: Insufficient documentation

## 2020-03-03 DIAGNOSIS — K648 Other hemorrhoids: Secondary | ICD-10-CM | POA: Diagnosis not present

## 2020-03-03 DIAGNOSIS — Z6833 Body mass index (BMI) 33.0-33.9, adult: Secondary | ICD-10-CM | POA: Diagnosis not present

## 2020-03-03 DIAGNOSIS — R1013 Epigastric pain: Secondary | ICD-10-CM | POA: Insufficient documentation

## 2020-03-03 DIAGNOSIS — B9681 Helicobacter pylori [H. pylori] as the cause of diseases classified elsewhere: Secondary | ICD-10-CM | POA: Diagnosis not present

## 2020-03-03 DIAGNOSIS — E669 Obesity, unspecified: Secondary | ICD-10-CM | POA: Diagnosis not present

## 2020-03-03 HISTORY — PX: BIOPSY: SHX5522

## 2020-03-03 HISTORY — PX: ESOPHAGOGASTRODUODENOSCOPY (EGD) WITH PROPOFOL: SHX5813

## 2020-03-03 HISTORY — PX: COLONOSCOPY WITH PROPOFOL: SHX5780

## 2020-03-03 SURGERY — COLONOSCOPY WITH PROPOFOL
Anesthesia: General

## 2020-03-03 MED ORDER — GLYCOPYRROLATE 0.2 MG/ML IJ SOLN: 0.2000 mg | Freq: Once | INTRAMUSCULAR | Status: DC

## 2020-03-03 MED ORDER — LIDOCAINE VISCOUS HCL 2 % MT SOLN: 15.0000 mL | Freq: Once | OROMUCOSAL | Status: DC

## 2020-03-03 MED ORDER — STERILE WATER FOR IRRIGATION IR SOLN
Status: DC | PRN
  Administered 2020-03-03: 1.5 mL

## 2020-03-03 MED ORDER — CHLORHEXIDINE GLUCONATE CLOTH 2 % EX PADS: 6.0000 | MEDICATED_PAD | Freq: Once | CUTANEOUS | Status: DC

## 2020-03-03 MED ORDER — LACTATED RINGERS IV SOLN: INTRAVENOUS | Status: DC

## 2020-03-03 MED ORDER — LIDOCAINE HCL (CARDIAC) PF 100 MG/5ML IV SOSY
PREFILLED_SYRINGE | INTRAVENOUS | Status: DC | PRN
  Administered 2020-03-03: 100 mg via INTRAVENOUS

## 2020-03-03 MED ORDER — PANTOPRAZOLE SODIUM 40 MG PO TBEC: 40.0000 mg | DELAYED_RELEASE_TABLET | Freq: Two times a day (BID) | ORAL | 5 refills | Status: AC

## 2020-03-03 MED ORDER — PROPOFOL 10 MG/ML IV BOLUS
INTRAVENOUS | Status: DC | PRN
  Administered 2020-03-03 (×2): 100 mg via INTRAVENOUS
  Administered 2020-03-03: 150 ug/kg/min via INTRAVENOUS
  Administered 2020-03-03: 50 mg via INTRAVENOUS

## 2020-03-03 NOTE — Anesthesia Postprocedure Evaluation (Signed)
Anesthesia Post Note  Patient: Sue Nguyen  Procedure(s) Performed: COLONOSCOPY WITH PROPOFOL (N/A ) ESOPHAGOGASTRODUODENOSCOPY (EGD) WITH PROPOFOL (N/A ) BIOPSY  Patient location during evaluation: Endoscopy Anesthesia Type: General Level of consciousness: awake, oriented, awake and alert and patient cooperative Pain management: pain level controlled Vital Signs Assessment: post-procedure vital signs reviewed and stable Respiratory status: spontaneous breathing, respiratory function stable and nonlabored ventilation Cardiovascular status: blood pressure returned to baseline and stable Postop Assessment: no headache and no backache Anesthetic complications: no   No complications documented.   Last Vitals:  Vitals:   03/03/20 0842  BP: 119/71  Pulse: 65  Resp: 15  Temp: 37.1 C  SpO2: 100%    Last Pain:  Vitals:   03/03/20 0933  TempSrc:   PainSc: 0-No pain                 Tacy Learn

## 2020-03-03 NOTE — Interval H&P Note (Signed)
History and Physical Interval Note:  03/03/2020 9:06 AM  Sue Nguyen  has presented today for surgery, with the diagnosis of abd pain, diarrhea, bloating.  The various methods of treatment have been discussed with the patient and family. After consideration of risks, benefits and other options for treatment, the patient has consented to  Procedure(s) with comments: COLONOSCOPY WITH PROPOFOL (N/A) - 9:45am ESOPHAGOGASTRODUODENOSCOPY (EGD) WITH PROPOFOL (N/A) as a surgical intervention.  The patient's history has been reviewed, patient examined, no change in status, stable for surgery.  I have reviewed the patient's chart and labs.  Questions were answered to the patient's satisfaction.     Eloise Harman

## 2020-03-03 NOTE — Transfer of Care (Signed)
Immediate Anesthesia Transfer of Care Note  Patient: Sue Nguyen  Procedure(s) Performed: COLONOSCOPY WITH PROPOFOL (N/A ) ESOPHAGOGASTRODUODENOSCOPY (EGD) WITH PROPOFOL (N/A ) BIOPSY  Patient Location: Endoscopy Unit  Anesthesia Type:General  Level of Consciousness: awake, alert , oriented and patient cooperative  Airway & Oxygen Therapy: Patient Spontanous Breathing  Post-op Assessment: Report given to RN, Post -op Vital signs reviewed and stable and Patient moving all extremities  Post vital signs: Reviewed and stable  Last Vitals:  Vitals Value Taken Time  BP    Temp    Pulse    Resp    SpO2      Last Pain:  Vitals:   03/03/20 0933  TempSrc:   PainSc: 0-No pain      Patients Stated Pain Goal: 6 (09/12/54 1548)  Complications: No complications documented.

## 2020-03-03 NOTE — Op Note (Signed)
[facility] Patient Name: Sue Nguyen Procedure Date: 03/03/2020 9:45 AM MRN: [id number] Date of Birth: May 23, 1980 Attending MD: Elon Alas. Abbey Chatters DO CSN: [id number] Age: 39 Admit Type: Outpatient Procedure:                Colonoscopy Indications:              Lower abdominal pain, Chronic diarrhea Providers:                Elon Alas. Abbey Chatters, DO, Caprice Kluver, Casimer Bilis, Technician Referring MD:              Medicines:                See the Anesthesia note for documentation of the                            administered medications Complications:            No immediate complications. Estimated Blood Loss:     Estimated blood loss was minimal. Procedure:                Pre-Anesthesia Assessment:                           - The anesthesia plan was to use monitored                            anesthesia care (MAC).                           After obtaining informed consent, the colonoscope                            was passed under direct vision. Throughout the                            procedure, the patient's blood pressure, pulse, and                            oxygen saturations were monitored continuously. The                            [id number]-[id number] ([id number]) scope was introduced through                            the anus and advanced to the the terminal ileum,                            with identification of the appendiceal orifice and                            IC valve. The colonoscopy was performed without                            difficulty. The patient tolerated the  procedure                            well. The quality of the bowel preparation was                            evaluated using the BBPS [facility] Bowel Preparation                            Scale) with scores of: Right Colon = 2 (minor                            amount of residual staining, small fragments of                            stool and/or opaque liquid, but  mucosa seen well),                            Transverse Colon = 3 (entire mucosa seen well with                            no residual staining, small fragments of stool or                            opaque liquid) and Left Colon = 3 (entire mucosa                            seen well with no residual staining, small                            fragments of stool or opaque liquid). The total                            BBPS score equals 8. The quality of the bowel                            preparation was good. Scope In: 9:47:01 AM Scope Out: 10:07:53 AM Scope Withdrawal Time: 0 hours 15 minutes 16 seconds  Total Procedure Duration: 0 hours 20 minutes 52 seconds  Findings:      The perianal and digital rectal examinations were normal.      Non-bleeding internal hemorrhoids were found during endoscopy.      The terminal ileum appeared normal. Biopsies were taken with a cold       forceps for histology.      Biopsies for histology were taken with a cold forceps from the ascending       colon, transverse colon and descending colon for evaluation of       microscopic colitis.      A single (solitary) fourteen mm ulcer was found at the ileocecal valve.       No bleeding was present. No stigmata of recent bleeding were seen.       Biopsies for histology were taken with a cold forceps for evaluation of       celiac disease. Biopsies were taken  with a cold forceps for histology. Impression:               - Non-bleeding internal hemorrhoids.                           - The examined portion of the ileum was normal.                            Biopsied.                           - A single (solitary) ulcer at the ileocecal valve.                            Biopsied.                           - Biopsies were taken with a cold forceps from the                            ascending colon, transverse colon and descending                            colon for evaluation of microscopic colitis. Moderate  Sedation:      Per Anesthesia Care Recommendation:           - Patient has a contact number available for                            emergencies. The signs and symptoms of potential                            delayed complications were discussed with the                            patient. Return to normal activities tomorrow.                            Written discharge instructions were provided to the                            patient.                           - Resume previous diet.                           - Continue present medications.                           - Await pathology results.                           - Repeat colonoscopy in 10 years for screening                            purposes.                           -  Return to GI clinic at appointment to be                            scheduled. Procedure Code(s):        --- Professional ---                           [phone], Colonoscopy, flexible; with biopsy, single                            or multiple Diagnosis Code(s):        --- Professional ---                           K64.8, Other hemorrhoids                           K63.3, Ulcer of intestine                           R10.30, Lower abdominal pain, unspecified                           K52.9, Noninfective gastroenteritis and colitis,                            unspecified CPT copyright 2019 American Medical Association. All rights reserved. The codes documented in this report are preliminary and upon coder review may  be revised to meet current compliance requirements. Elon Alas. Abbey Chatters, DO [place] Abbey Chatters, DO 03/03/2020 10:15:21 AM This report has been signed electronically. Number of Addenda: 0

## 2020-03-03 NOTE — Discharge Instructions (Signed)
EGD Discharge instructions Please read the instructions outlined below and refer to this sheet in the next few weeks. These discharge instructions provide you with general information on caring for yourself after you leave the hospital. Your doctor may also give you specific instructions. While your treatment has been planned according to the most current medical practices available, unavoidable complications occasionally occur. If you have any problems or questions after discharge, please call your doctor. ACTIVITY  You may resume your regular activity but move at a slower pace for the next 24 hours.   Take frequent rest periods for the next 24 hours.   Walking will help expel (get rid of) the air and reduce the bloated feeling in your abdomen.   No driving for 24 hours (because of the anesthesia (medicine) used during the test).   You may shower.   Do not sign any important legal documents or operate any machinery for 24 hours (because of the anesthesia used during the test).  NUTRITION  Drink plenty of fluids.   You may resume your normal diet.   Begin with a light meal and progress to your normal diet.   Avoid alcoholic beverages for 24 hours or as instructed by your caregiver.  MEDICATIONS  You may resume your normal medications unless your caregiver tells you otherwise.  WHAT YOU CAN EXPECT TODAY  You may experience abdominal discomfort such as a feeling of fullness or gas pains.  FOLLOW-UP  Your doctor will discuss the results of your test with you.  SEEK IMMEDIATE MEDICAL ATTENTION IF ANY OF THE FOLLOWING OCCUR:  Excessive nausea (feeling sick to your stomach) and/or vomiting.   Severe abdominal pain and distention (swelling).   Trouble swallowing.   Temperature over 101 F (37.8 C).   Rectal bleeding or vomiting of blood.     Colonoscopy Discharge Instructions  Read the instructions outlined below and refer to this sheet in the next few weeks. These  discharge instructions provide you with general information on caring for yourself after you leave the hospital. Your doctor may also give you specific instructions. While your treatment has been planned according to the most current medical practices available, unavoidable complications occasionally occur.   ACTIVITY  You may resume your regular activity, but move at a slower pace for the next 24 hours.   Take frequent rest periods for the next 24 hours.   Walking will help get rid of the air and reduce the bloated feeling in your belly (abdomen).   No driving for 24 hours (because of the medicine (anesthesia) used during the test).    Do not sign any important legal documents or operate any machinery for 24 hours (because of the anesthesia used during the test).  NUTRITION  Drink plenty of fluids.   You may resume your normal diet as instructed by your doctor.   Begin with a light meal and progress to your normal diet. Heavy or fried foods are harder to digest and may make you feel sick to your stomach (nauseated).   Avoid alcoholic beverages for 24 hours or as instructed.  MEDICATIONS  You may resume your normal medications unless your doctor tells you otherwise.  WHAT YOU CAN EXPECT TODAY  Some feelings of bloating in the abdomen.   Passage of more gas than usual.   Spotting of blood in your stool or on the toilet paper.  IF YOU HAD POLYPS REMOVED DURING THE COLONOSCOPY:  No aspirin products for 7 days or as instructed.  No alcohol for 7 days or as instructed.   Eat a soft diet for the next 24 hours.  FINDING OUT THE RESULTS OF YOUR TEST Not all test results are available during your visit. If your test results are not back during the visit, make an appointment with your caregiver to find out the results. Do not assume everything is normal if you have not heard from your caregiver or the medical facility. It is important for you to follow up on all of your test results.   SEEK IMMEDIATE MEDICAL ATTENTION IF:  You have more than a spotting of blood in your stool.   Your belly is swollen (abdominal distention).   You are nauseated or vomiting.   You have a temperature over 101.   You have abdominal pain or discomfort that is severe or gets worse throughout the day.   Your EGD showed a moderate amount of inflammation both in your stomach and small bowel. I biopsied your stomach to rule out infection with a bacteria called H. pylori. We should have these results back by next week. I am going to start you on pantoprazole 40 mg twice daily for the next 8 weeks and then can decrease to once daily thereafter. I would avoid NSAIDs as best you can.  Your colonoscopy showed a large ulcer on the valve that can connects your small bowel to your colon. I biopsied this and we should have these results back by next week. These can be seen with underlying inflammatory bowel disease such as Crohn's disease, as well as infections, NSAID use. I will contact you as soon as we get these results next week. I also biopsied your colon to rule out a condition called microscopic colitis as well.  I hope you have a great rest of your week!  Elon Alas. Abbey Chatters, D.O. Gastroenterology and Hepatology [facility]

## 2020-03-03 NOTE — Op Note (Signed)
[facility] Patient Name: Sue Nguyen Procedure Date: 03/03/2020 9:28 AM MRN: [id number] Date of Birth: June 07, 1980 Attending MD: Elon Alas. Abbey Chatters DO CSN: [id number] Age: 39 Admit Type: Outpatient Procedure:                Upper GI endoscopy Indications:              Epigastric abdominal pain, Abdominal bloating Providers:                Elon Alas. Abbey Chatters, DO, Caprice Kluver, Casimer Bilis, Technician Referring MD:              Medicines:                See the Anesthesia note for documentation of the                            administered medications Complications:            No immediate complications. Estimated Blood Loss:     Estimated blood loss was minimal. Procedure:                Pre-Anesthesia Assessment:                           - The anesthesia plan was to use monitored                            anesthesia care (MAC).                           After obtaining informed consent, the endoscope was                            passed under direct vision. Throughout the                            procedure, the patient's blood pressure, pulse, and                            oxygen saturations were monitored continuously. The                            [id number]-H190 ([id number]) scope was introduced through the                            mouth, and advanced to the second part of duodenum.                            The upper GI endoscopy was accomplished without                            difficulty. The patient tolerated the procedure                            well. Scope In:  9:38:07 AM Scope Out: 9:43:21 AM Total Procedure Duration: 0 hours 5 minutes 14 seconds  Findings:      The Z-line was variable. Biopsies were taken with a cold forceps for       histology.      There is no endoscopic evidence of bleeding, areas of erosion,       esophagitis, hiatal hernia, ulcerations or varices in the entire       esophagus.      Diffuse mild  inflammation characterized by erythema was found in the       stomach. Biopsies were taken with a cold forceps for Helicobacter pylori       testing.      Localized moderate inflammation characterized by erosions and erythema       was found in the duodenal bulb and in the first portion of the duodenum.       Biopsies were taken with a cold forceps for histology. Impression:               - Z-line variable. Biopsied.                           - Gastritis. Biopsied.                           - Duodenitis. Biopsied. Moderate Sedation:      Per Anesthesia Care Recommendation:           - Patient has a contact number available for                            emergencies. The signs and symptoms of potential                            delayed complications were discussed with the                            patient. Return to normal activities tomorrow.                            Written discharge instructions were provided to the                            patient.                           - Resume previous diet.                           - Continue present medications.                           - Await pathology results.                           - Use a proton pump inhibitor PO BID for 8 weeks. Procedure Code(s):        --- Professional ---                           [phone],  Esophagogastroduodenoscopy, flexible,                            transoral; with biopsy, single or multiple Diagnosis Code(s):        --- Professional ---                           K22.8, Other specified diseases of esophagus                           K29.70, Gastritis, unspecified, without bleeding                           K29.80, Duodenitis without bleeding                           R10.13, Epigastric pain                           R14.0, Abdominal distension (gaseous) CPT copyright 2019 American Medical Association. All rights reserved. The codes documented in this report are preliminary and upon coder review may   be revised to meet current compliance requirements. Elon Alas. Abbey Chatters, DO [place] Abbey Chatters, DO 03/03/2020 9:45:46 AM This report has been signed electronically. Number of Addenda: 0

## 2020-03-03 NOTE — Anesthesia Preprocedure Evaluation (Signed)
Anesthesia Evaluation  Patient identified by MRN, date of birth, ID band Patient awake    Reviewed: Allergy & Precautions, H&P , NPO status , Patient's Chart, lab work & pertinent test results, reviewed documented beta blocker date and time   Airway Mallampati: II  TM Distance: >3 FB Neck ROM: full    Dental no notable dental hx.    Pulmonary neg pulmonary ROS, former smoker,    Pulmonary exam normal breath sounds clear to auscultation       Cardiovascular Exercise Tolerance: Good negative cardio ROS   Rhythm:regular Rate:Normal     Neuro/Psych PSYCHIATRIC DISORDERS Depression  Neuromuscular disease    GI/Hepatic negative GI ROS, Neg liver ROS,   Endo/Other  negative endocrine ROS  Renal/GU negative Renal ROS  negative genitourinary   Musculoskeletal   Abdominal   Peds  Hematology negative hematology ROS (+)   Anesthesia Other Findings   Reproductive/Obstetrics negative OB ROS                             Anesthesia Physical Anesthesia Plan  ASA: II  Anesthesia Plan: General   Post-op Pain Management:    Induction:   PONV Risk Score and Plan: Propofol infusion  Airway Management Planned:   Additional Equipment:   Intra-op Plan:   Post-operative Plan:   Informed Consent: I have reviewed the patients History and Physical, chart, labs and discussed the procedure including the risks, benefits and alternatives for the proposed anesthesia with the patient or authorized representative who has indicated his/her understanding and acceptance.     Dental Advisory Given  Plan Discussed with: CRNA  Anesthesia Plan Comments:         Anesthesia Quick Evaluation

## 2020-03-06 LAB — SURGICAL PATHOLOGY

## 2020-03-07 ENCOUNTER — Encounter (HOSPITAL_COMMUNITY): Payer: Self-pay | Admitting: Internal Medicine

## 2020-03-15 ENCOUNTER — Telehealth: Payer: Self-pay | Admitting: Internal Medicine

## 2020-03-15 ENCOUNTER — Encounter: Payer: Self-pay | Admitting: Internal Medicine

## 2020-03-15 NOTE — Telephone Encounter (Signed)
Phoned and advised pt that once the Dr sign off on her results I would call her.

## 2020-03-15 NOTE — Telephone Encounter (Signed)
Pt was calling to see if her colonoscopy results were back, [phone]

## 2020-03-20 ENCOUNTER — Telehealth: Payer: Self-pay

## 2020-03-20 ENCOUNTER — Other Ambulatory Visit: Payer: Self-pay

## 2020-03-20 NOTE — Telephone Encounter (Signed)
Pt received letter to call office about her results. She may be reached at [phone].

## 2020-03-20 NOTE — Telephone Encounter (Signed)
Amoxicillin, Biaxin, Lansoprazole will work great. She needs to hold her Nexium while taking her H pylori treatment. She can resume Nexium once she has completed her course of triple therapy. Please have her see me in 5-6 weeks to discuss eradication testing. Thank you!

## 2020-03-20 NOTE — Telephone Encounter (Signed)
Spoke with pt. Pt was given results. Per Dr. Abbey Chatters, send [place]. Prevpac isn't covered under pts insurance and it's asking for an alternate medication. Please advise if you would like pt to take Amoxicillin, Biaxin and Lansoprazole. Pt is already on Nexium and I need clarity of medication that needs to be called in for pt.

## 2020-03-21 ENCOUNTER — Other Ambulatory Visit: Payer: Self-pay

## 2020-03-21 MED ORDER — CLARITHROMYCIN 500 MG PO TABS: 500.0000 mg | ORAL_TABLET | Freq: Two times a day (BID) | ORAL | 0 refills | Status: DC

## 2020-03-21 MED ORDER — LANSOPRAZOLE 30 MG PO CPDR: DELAYED_RELEASE_CAPSULE | ORAL | 0 refills | Status: AC

## 2020-03-21 MED ORDER — AMOXICILLIN 500 MG PO TABS: ORAL_TABLET | ORAL | 0 refills | Status: DC

## 2020-03-21 NOTE — Telephone Encounter (Signed)
Noted. Left a detailed message for pt. Amoxicillin 1000 mg bid x 14 days #56, Biaxin 500 mg bid x 14 days #28, Lansoprazole 30 mg bis x 14 days #28, was sent to pts pharmacy. Pt was advised to d/c Nexium x 14 days until she finishes these medications.

## 2020-03-24 ENCOUNTER — Telehealth: Payer: Self-pay | Admitting: Internal Medicine

## 2020-03-24 ENCOUNTER — Other Ambulatory Visit: Payer: Self-pay | Admitting: *Deleted

## 2020-03-24 MED ORDER — PYLERA 140-125-125 MG PO CAPS: 3.0000 | ORAL_CAPSULE | Freq: Three times a day (TID) | ORAL | 0 refills | Status: AC

## 2020-03-24 NOTE — Telephone Encounter (Signed)
Pt wants to speak to  Nurse about a  [place]. [phone]

## 2020-03-24 NOTE — Telephone Encounter (Signed)
Spoke with Dr. Abbey Chatters.  Informed pt that we have few options as far as medication wise.  Asked pt if she had been taking medication with food.  She said she only had a shake with it.  Asked pt if she would like to wait a few more days to see if it would be working better and take with food.  Pt said that she really didn't feel like this was helping her.  She said she would really like to switch to something else.  Switched to pylera as advised by Dr. Abbey Chatters since pt said that she could absolutely not tolerate the prevpac anymore.

## 2020-03-24 NOTE — Telephone Encounter (Signed)
Phoned pt and was advised she had already spoken to someone from here and she stated they are helping her. I just checked it is Sue Nguyen who is helping her. (noted)

## 2020-03-24 NOTE — Telephone Encounter (Signed)
Spoke with pt.  She just started Prevpac yesterday.  Pt complaining of burning in lower abd, bloating, and waves of pain.  States that she can't take medication.  Wants to know if we can send in something different.    Dr. Abbey Chatters: What do you recommend?

## 2020-03-29 NOTE — Telephone Encounter (Signed)
Noted, thanks!

## 2020-04-17 ENCOUNTER — Ambulatory Visit: Payer: Medicaid Other | Admitting: Internal Medicine

## 2020-04-17 ENCOUNTER — Encounter: Payer: Self-pay | Admitting: Internal Medicine

## 2020-04-17 ENCOUNTER — Other Ambulatory Visit: Payer: Self-pay

## 2020-04-17 DIAGNOSIS — A048 Other specified bacterial intestinal infections: Secondary | ICD-10-CM | POA: Diagnosis not present

## 2020-04-17 DIAGNOSIS — K633 Ulcer of intestine: Secondary | ICD-10-CM | POA: Diagnosis not present

## 2020-04-17 NOTE — Progress Notes (Signed)
Referring Provider: Ginger Organ Primary Care Physician:  Cory Munch, PA-C Primary GI:  Dr. Abbey Chatters  Chief Complaint  Patient presents with  . Bloated    doing much better since finishing abx    HPI:   Sue Nguyen is a 39 y.o. female who presents to the clinic today for follow-up visit.  She had previously seen for multiple GI complaints.  EGD showed gastritis with biopsies positive for H. pylori.  She was treated with antibiotic therapy and notes vast improvement in her symptoms.  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  Biopsies consent with NSAID induced ulcer.  Patient states she has been avoiding all NSAIDs since that time and again notes improvement in her symptoms.  Past Medical History:  Diagnosis Date  . Breast nodule 10/19/2013   Will get [place]  . Contraceptive management 10/19/2013  . Mental disorder    moody  . Obesity   . UTI (lower urinary tract infection) 07/05/2014  . Vaginal Pap smear, abnormal     Past Surgical History:  Procedure Laterality Date  . BIOPSY  03/03/2020   Procedure: BIOPSY;  Surgeon: Eloise Harman, DO;  Location: [facility];  Service: Endoscopy;;  . CERVICAL BIOPSY    . CHOLECYSTECTOMY  04/2013  . COLONOSCOPY WITH PROPOFOL N/A 03/03/2020   Procedure: COLONOSCOPY WITH PROPOFOL;  Surgeon: Eloise Harman, DO;  Location: [facility];  Service: Endoscopy;  Laterality: N/A;  9:45am  . ESOPHAGOGASTRODUODENOSCOPY (EGD) WITH PROPOFOL N/A 03/03/2020   Procedure: ESOPHAGOGASTRODUODENOSCOPY (EGD) WITH PROPOFOL;  Surgeon: Eloise Harman, DO;  Location: [facility];  Service: Endoscopy;  Laterality: N/A;    Current Outpatient Medications  Medication Sig Dispense Refill  . glucosamine-chondroitin 500-400 MG tablet Take 1 tablet by mouth in the morning and at bedtime.     . Multiple Vitamins-Minerals (HAIR SKIN AND NAILS FORMULA) TABS Take 1 tablet by mouth daily.    Marland Kitchen omega-3 acid ethyl esters (LOVAZA) 1 g  capsule Take 1 g by mouth daily.    Marland Kitchen amoxicillin (AMOXIL) 500 MG tablet Take 1000 mg bid x 14 days (Patient not taking: Reported on 04/17/2020) 56 tablet 0  . aspirin-acetaminophen-caffeine (EXCEDRIN MIGRAINE) 250-250-65 MG tablet Take 1 tablet by mouth daily as needed for headache. (Patient not taking: Reported on 04/17/2020)    . bismuth-metronidazole-tetracycline (PYLERA) 140-125-125 MG capsule Take 3 capsules by mouth 4 (four) times daily -  before meals and at bedtime. (Patient not taking: Reported on 04/17/2020) 120 capsule 0  . clarithromycin (BIAXIN) 500 MG tablet Take 1 tablet (500 mg total) by mouth 2 (two) times daily. (Patient not taking: Reported on 04/17/2020) 28 tablet 0  . lansoprazole (PREVACID) 30 MG capsule 30 mg bid x 14 days (Patient not taking: Reported on 04/17/2020) 28 capsule 0  . pantoprazole (PROTONIX) 40 MG tablet Take 1 tablet (40 mg total) by mouth 2 (two) times daily. (Patient not taking: Reported on 04/17/2020) 60 tablet 5   No current facility-administered medications for this visit.    Allergies as of 04/17/2020  . (No Known Allergies)    Family History  Problem Relation Age of Onset  . Cancer Maternal Grandfather        lung  . Alcohol abuse Maternal Grandfather   . Alcohol abuse Maternal Grandmother        recovering alcoholic  . Dementia Maternal Grandmother   . Hypertension Maternal Grandmother   . Diabetes Paternal Grandfather   .  Cancer Paternal Grandfather   . Hypertension Paternal Grandmother   . Hyperlipidemia Paternal Grandmother   . Thyroid disease Mother   . Alcohol abuse Other        mom's side of family  . Diabetes Maternal Uncle   . Hypertension Maternal Uncle   . Other Maternal Uncle        spinal problems in wheel chair    Social History   Socioeconomic History  . Marital status: Married    Spouse name: Not on file  . Number of children: Not on file  . Years of education: Not on file  . Highest education level: Not on file   Occupational History  . Not on file  Tobacco Use  . Smoking status: Former Smoker    Types: Cigarettes    Quit date: 10/17/2014    Years since quitting: 5.5  . Smokeless tobacco: Never Used  Substance and Sexual Activity  . Alcohol use: No  . Drug use: No  . Sexual activity: Yes    Birth control/protection: None  Other Topics Concern  . Not on file  Social History Narrative  . Not on file   Social Determinants of Health   Financial Resource Strain:   . Difficulty of Paying Living Expenses: Not on file  Food Insecurity:   . Worried About Charity fundraiser in the Last Year: Not on file  . Ran Out of Food in the Last Year: Not on file  Transportation Needs:   . Lack of Transportation (Medical): Not on file  . Lack of Transportation (Non-Medical): Not on file  Physical Activity:   . Days of Exercise per Week: Not on file  . Minutes of Exercise per Session: Not on file  Stress:   . Feeling of Stress : Not on file  Social Connections:   . Frequency of Communication with Friends and Family: Not on file  . Frequency of Social Gatherings with Friends and Family: Not on file  . Attends Religious Services: Not on file  . Active Member of Clubs or Organizations: Not on file  . Attends Archivist Meetings: Not on file  . Marital Status: Not on file    Subjective: Review of Systems  Constitutional: Negative for chills and fever.  HENT: Negative for congestion and hearing loss.   Eyes: Negative for blurred vision and double vision.  Respiratory: Negative for cough and shortness of breath.   Cardiovascular: Negative for chest pain and palpitations.  Gastrointestinal: Negative for abdominal pain, blood in stool, constipation, diarrhea, heartburn, melena and vomiting.  Genitourinary: Negative for dysuria and urgency.  Musculoskeletal: Negative for joint pain and myalgias.  Skin: Negative for itching and rash.  Neurological: Negative for dizziness and headaches.   Psychiatric/Behavioral: Negative for depression. The patient is not nervous/anxious.      Objective: BP 126/75   Pulse 88   Temp (!) 97.1 F (36.2 C)   Ht 6\' 1"  (1.854 m)   Wt 243 lb (110.2 kg)   LMP 04/10/2020   BMI 32.06 kg/m  Physical Exam Constitutional:      Appearance: Normal appearance.  HENT:     Head: Normocephalic and atraumatic.  Eyes:     Extraocular Movements: Extraocular movements intact.     Conjunctiva/sclera: Conjunctivae normal.  Cardiovascular:     Rate and Rhythm: Normal rate and regular rhythm.  Pulmonary:     Effort: Pulmonary effort is normal.     Breath sounds: Normal breath sounds.  Abdominal:  General: Bowel sounds are normal.     Palpations: Abdomen is soft.  Musculoskeletal:        General: No swelling. Normal range of motion.     Cervical back: Normal range of motion and neck supple.  Skin:    General: Skin is warm and dry.     Coloration: Skin is not jaundiced.  Neurological:     General: No focal deficit present.     Mental Status: She is alert and oriented to person, place, and time.  Psychiatric:        Mood and Affect: Mood normal.        Behavior: Behavior normal.      Assessment: *H. pylori gastritis *Ileocecal valve ulceration  Plan: From a symptomatic standpoint, patient is vastly improved after completing antibiotic therapy for H. pylori gastritis.  We will order breath testing to check for eradication.  Patient counseled to stay off PPI therapy as this could interfere with her testing.  She understands.  Patient's lower GI symptoms also improved.  She has been avoiding NSAIDs since her colonoscopy.  We will tentatively plan on repeat colonoscopy in 2 to 3 months to evaluate healing.  Counseled on avoiding NSAIDs as best she can.  Patient to follow-up in 3 months or sooner if needed  04/17/2020 11:15 AM   Disclaimer: This note was dictated with voice recognition software. Similar sounding words can inadvertently be  transcribed and may not be corrected upon review.

## 2020-04-17 NOTE — Patient Instructions (Signed)
We will check eradication testing to ensure that the H. pylori infection is gone.  This will be a breath test that you will need to complete in 1 to 2 weeks.  Be sure to stay off any PPIs including Nexium, Prilosec, etc as this will potentially interfere with the test.  We will tentatively plan on repeat colonoscopy in 3 to 4 months to evaluate healing of your colonic ulcer. Avoid NSAIDs as best as you can.  I hope you have a great Christmas and new year.  At [facility] we value your feedback. You may receive a survey about your visit today. Please share your experience as we strive to create trusting relationships with our patients to provide genuine, compassionate, quality care.  We appreciate your understanding and patience as we review any laboratory studies, imaging, and other diagnostic tests that are ordered as we care for you. Our office policy is 5 business days for review of these results, and any emergent or urgent results are addressed in a timely manner for your best interest. If you do not hear from our office in 1 week, please contact us.   We also encourage the use of MyChart, which contains your medical information for your review as well. If you are not enrolled in this feature, an access code is on this after visit summary for your convenience. Thank you for allowing [place] to be involved in your care.  It was great to see you today!  I hope you have a great rest of your winter!!    Sue Nguyen. Abbey Chatters, D.O. Gastroenterology and Hepatology [facility]

## 2020-04-20 ENCOUNTER — Ambulatory Visit: Payer: Medicaid Other | Admitting: Internal Medicine

## 2020-05-17 ENCOUNTER — Other Ambulatory Visit: Payer: Self-pay | Admitting: Internal Medicine

## 2020-05-19 LAB — H. PYLORI BREATH TEST: H pylori Breath Test: NEGATIVE

## 2020-07-26 ENCOUNTER — Ambulatory Visit: Payer: Medicaid Other | Admitting: Internal Medicine

## 2020-07-26 ENCOUNTER — Encounter: Payer: Self-pay | Admitting: *Deleted

## 2020-07-26 ENCOUNTER — Encounter: Payer: Self-pay | Admitting: Internal Medicine

## 2020-07-26 ENCOUNTER — Other Ambulatory Visit: Payer: Self-pay

## 2020-07-26 VITALS — BP 131/87 | HR 82 | Temp 97.5°F | Ht 73.0 in | Wt 239.0 lb

## 2020-07-26 DIAGNOSIS — K633 Ulcer of intestine: Secondary | ICD-10-CM

## 2020-07-26 DIAGNOSIS — A048 Other specified bacterial intestinal infections: Secondary | ICD-10-CM | POA: Diagnosis not present

## 2020-07-26 MED ORDER — CLENPIQ 10-3.5-12 MG-GM -GM/160ML PO SOLN: 1.0000 | Freq: Once | ORAL | 0 refills | Status: AC

## 2020-07-26 NOTE — Progress Notes (Signed)
Referring Provider: Ginger Organ Primary Care Physician:  Cory Munch, PA-C Primary GI:  Dr. Abbey Chatters  Chief Complaint  Patient presents with  . Follow-up    ? Discuss TCS, no stomach issues    HPI:   Sue Nguyen is a 40 y.o. female who presents for follow-up visit.  She had previously seen for multiple GI complaints.  EGD showed gastritis with biopsies positive for H. pylori.  She was treated with antibiotic therapy and notes vast improvement in her symptoms.    Subsequent eradication testing was negative   Colonoscopy revealed a large ulcer on her ileocecal valve.  Biopsies consent with NSAID induced ulcer.  Patient states she has been avoiding all NSAIDs for the most part besides an Excedrin once a month for migraines since that time and again notes improvement in her symptoms.  Past Medical History:  Diagnosis Date  . Breast nodule 10/19/2013   Will get [place]  . Contraceptive management 10/19/2013  . Mental disorder    moody  . Obesity   . UTI (lower urinary tract infection) 07/05/2014  . Vaginal Pap smear, abnormal     Past Surgical History:  Procedure Laterality Date  . BIOPSY  03/03/2020   Procedure: BIOPSY;  Surgeon: Eloise Harman, DO;  [facility];  Service: Endoscopy;;  . CERVICAL BIOPSY    . CHOLECYSTECTOMY  04/2013  . COLONOSCOPY WITH PROPOFOL N/A 03/03/2020   Procedure: COLONOSCOPY WITH PROPOFOL;  Surgeon: Eloise Harman, DO;  [facility];  Service: Endoscopy;  Laterality: N/A;  9:45am  . ESOPHAGOGASTRODUODENOSCOPY (EGD) WITH PROPOFOL N/A 03/03/2020   Procedure: ESOPHAGOGASTRODUODENOSCOPY (EGD) WITH PROPOFOL;  Surgeon: Eloise Harman, DO;  [facility];  Service: Endoscopy;  Laterality: N/A;    Current Outpatient Medications  Medication Sig Dispense Refill  . aspirin-acetaminophen-caffeine (EXCEDRIN MIGRAINE) 250-250-65 MG tablet Take 1 tablet by mouth daily as needed for headache.    .  glucosamine-chondroitin 500-400 MG tablet Take 1 tablet by mouth in the morning and at bedtime.     . Multiple Vitamins-Minerals (HAIR SKIN AND NAILS FORMULA) TABS Take 1 tablet by mouth daily.    Marland Kitchen omega-3 acid ethyl esters (LOVAZA) 1 g capsule Take 1 g by mouth daily.    Marland Kitchen amoxicillin (AMOXIL) 500 MG tablet Take 1000 mg bid x 14 days (Patient not taking: No sig reported) 56 tablet 0  . bismuth-metronidazole-tetracycline (PYLERA) 140-125-125 MG capsule Take 3 capsules by mouth 4 (four) times daily -  before meals and at bedtime. (Patient not taking: No sig reported) 120 capsule 0  . clarithromycin (BIAXIN) 500 MG tablet Take 1 tablet (500 mg total) by mouth 2 (two) times daily. (Patient not taking: No sig reported) 28 tablet 0  . lansoprazole (PREVACID) 30 MG capsule 30 mg bid x 14 days (Patient not taking: No sig reported) 28 capsule 0  . pantoprazole (PROTONIX) 40 MG tablet Take 1 tablet (40 mg total) by mouth 2 (two) times daily. (Patient not taking: No sig reported) 60 tablet 5   No current facility-administered medications for this visit.    Allergies as of 07/26/2020  . (No Known Allergies)    Family History  Problem Relation Age of Onset  . Cancer Maternal Grandfather        lung  . Alcohol abuse Maternal Grandfather   . Alcohol abuse Maternal Grandmother        recovering alcoholic  . Dementia Maternal Grandmother   .  Hypertension Maternal Grandmother   . Diabetes Paternal Grandfather   . Cancer Paternal Grandfather   . Hypertension Paternal Grandmother   . Hyperlipidemia Paternal Grandmother   . Thyroid disease Mother   . Alcohol abuse Other        mom's side of family  . Diabetes Maternal Uncle   . Hypertension Maternal Uncle   . Other Maternal Uncle        spinal problems in wheel chair    Social History   Socioeconomic History  . Marital status: Married    Spouse name: Not on file  . Number of children: Not on file  . Years of education: Not on file  .  Highest education level: Not on file  Occupational History  . Not on file  Tobacco Use  . Smoking status: Former Smoker    Types: Cigarettes    Quit date: 10/17/2014    Years since quitting: 5.7  . Smokeless tobacco: Never Used  Substance and Sexual Activity  . Alcohol use: No  . Drug use: No  . Sexual activity: Yes    Birth control/protection: None  Other Topics Concern  . Not on file  Social History Narrative  . Not on file   Social Determinants of Health   Financial Resource Strain: Not on file  Food Insecurity: Not on file  Transportation Needs: Not on file  Physical Activity: Not on file  Stress: Not on file  Social Connections: Not on file    Subjective: Review of Systems  Constitutional: Negative for chills and fever.  HENT: Negative for congestion and hearing loss.   Eyes: Negative for blurred vision and double vision.  Respiratory: Negative for cough and shortness of breath.   Cardiovascular: Negative for chest pain and palpitations.  Gastrointestinal: Negative for abdominal pain, blood in stool, constipation, diarrhea, heartburn, melena and vomiting.  Genitourinary: Negative for dysuria and urgency.  Musculoskeletal: Negative for joint pain and myalgias.  Skin: Negative for itching and rash.  Neurological: Negative for dizziness and headaches.  Psychiatric/Behavioral: Negative for depression. The patient is not nervous/anxious.      Objective: BP 131/87   Pulse 82   Temp (!) 97.5 F (36.4 C) (Temporal)   Ht 6\' 1"  (1.854 m)   Wt 239 lb (108.4 kg)   LMP 06/27/2020   BMI 31.53 kg/m  Physical Exam Constitutional:      Appearance: Normal appearance.  HENT:     Head: Normocephalic and atraumatic.  Eyes:     Extraocular Movements: Extraocular movements intact.     Conjunctiva/sclera: Conjunctivae normal.  Cardiovascular:     Rate and Rhythm: Normal rate and regular rhythm.  Pulmonary:     Effort: Pulmonary effort is normal.     Breath sounds: Normal  breath sounds.  Abdominal:     General: Bowel sounds are normal.     Palpations: Abdomen is soft.  Musculoskeletal:        General: No swelling. Normal range of motion.     Cervical back: Normal range of motion and neck supple.  Skin:    General: Skin is warm and dry.     Coloration: Skin is not jaundiced.  Neurological:     General: No focal deficit present.     Mental Status: She is alert and oriented to person, place, and time.  Psychiatric:        Mood and Affect: Mood normal.        Behavior: Behavior normal.  Assessment: *H. pylori gastritis-eradication testing negative *Ileocecal valve ulceration  Plan: From a symptomatic standpoint, patient is vastly improved after completing antibiotic therapy for H. pylori gastritis.  Eradication testing negative.  No longer requiring PPI therapy.  Patient's lower GI symptoms also improved.  She has been avoiding NSAIDs since her colonoscopy.  We will schedule her for colonoscopy today to evaluate healing of previously noted ileocecal valve ulcer Counseled on using NSAIDs sparingly.  Counseled that if she finds herself using these more regularly, to call office and also put her on PPI for preventative measures.     07/26/2020 12:19 PM   Disclaimer: This note was dictated with voice recognition software. Similar sounding words can inadvertently be transcribed and may not be corrected upon review.

## 2020-07-26 NOTE — Patient Instructions (Signed)
We will schedule you for colonoscopy to evaluate healing of the ulcer seen on the right side of your colon.  If this is unremarkable you will be good for 10 years.  Further recommendations to follow.  NSAIDs okay to use sparingly.  If you find you are using these often then let me know and I will start you on preventative PPI therapy  At [facility] we value your feedback. You may receive a survey about your visit today. Please share your experience as we strive to create trusting relationships with our patients to provide genuine, compassionate, quality care.  We appreciate your understanding and patience as we review any laboratory studies, imaging, and other diagnostic tests that are ordered as we care for you. Our office policy is 5 business days for review of these results, and any emergent or urgent results are addressed in a timely manner for your best interest. If you do not hear from our office in 1 week, please contact us.   We also encourage the use of MyChart, which contains your medical information for your review as well. If you are not enrolled in this feature, an access code is on this after visit summary for your convenience. Thank you for allowing [place] to be involved in your care.  It was great to see you today!  I hope you have a great rest of your spring!!    Elon Alas. Abbey Chatters, D.O. Gastroenterology and Hepatology [facility]

## 2020-08-28 ENCOUNTER — Encounter (HOSPITAL_COMMUNITY): Payer: Self-pay

## 2020-08-30 ENCOUNTER — Other Ambulatory Visit (HOSPITAL_COMMUNITY)
Admission: RE | Admit: 2020-08-30 | Discharge: 2020-08-30 | Disposition: A | Discharge status: Home / Self Care | Payer: Medicaid Other | Source: Ambulatory Visit | Attending: Internal Medicine | Admitting: Internal Medicine

## 2020-08-30 ENCOUNTER — Other Ambulatory Visit: Payer: Self-pay

## 2020-08-30 DIAGNOSIS — Z20822 Contact with and (suspected) exposure to covid-19: Secondary | ICD-10-CM | POA: Insufficient documentation

## 2020-08-30 DIAGNOSIS — Z01812 Encounter for preprocedural laboratory examination: Secondary | ICD-10-CM | POA: Diagnosis present

## 2020-08-30 LAB — PREGNANCY, URINE: Preg Test, Ur: NEGATIVE

## 2020-08-30 LAB — SARS CORONAVIRUS 2 (TAT 6-24 HRS): SARS Coronavirus 2: NEGATIVE

## 2020-09-01 ENCOUNTER — Encounter (HOSPITAL_COMMUNITY)
Admission: RE | Disposition: A | Discharge status: Home / Self Care | Payer: Self-pay | Source: Home / Self Care | Attending: Internal Medicine

## 2020-09-01 ENCOUNTER — Ambulatory Visit (HOSPITAL_COMMUNITY): Payer: Medicaid Other | Admitting: Anesthesiology

## 2020-09-01 ENCOUNTER — Encounter (HOSPITAL_COMMUNITY): Payer: Self-pay

## 2020-09-01 ENCOUNTER — Other Ambulatory Visit: Payer: Self-pay

## 2020-09-01 ENCOUNTER — Ambulatory Visit (HOSPITAL_COMMUNITY)
Admission: RE | Admit: 2020-09-01 | Discharge: 2020-09-01 | Disposition: A | Discharge status: Home / Self Care | Payer: Medicaid Other | Attending: Internal Medicine | Admitting: Internal Medicine

## 2020-09-01 DIAGNOSIS — Z87891 Personal history of nicotine dependence: Secondary | ICD-10-CM | POA: Diagnosis not present

## 2020-09-01 DIAGNOSIS — Z8719 Personal history of other diseases of the digestive system: Secondary | ICD-10-CM | POA: Diagnosis not present

## 2020-09-01 DIAGNOSIS — K648 Other hemorrhoids: Secondary | ICD-10-CM | POA: Insufficient documentation

## 2020-09-01 DIAGNOSIS — Z79899 Other long term (current) drug therapy: Secondary | ICD-10-CM | POA: Diagnosis not present

## 2020-09-01 DIAGNOSIS — K529 Noninfective gastroenteritis and colitis, unspecified: Secondary | ICD-10-CM | POA: Insufficient documentation

## 2020-09-01 DIAGNOSIS — Z7982 Long term (current) use of aspirin: Secondary | ICD-10-CM | POA: Insufficient documentation

## 2020-09-01 DIAGNOSIS — R1031 Right lower quadrant pain: Secondary | ICD-10-CM | POA: Diagnosis not present

## 2020-09-01 HISTORY — PX: COLONOSCOPY WITH PROPOFOL: SHX5780

## 2020-09-01 SURGERY — COLONOSCOPY WITH PROPOFOL
Anesthesia: General

## 2020-09-01 MED ORDER — PROPOFOL 10 MG/ML IV BOLUS
INTRAVENOUS | Status: DC | PRN
  Administered 2020-09-01: 120 mg via INTRAVENOUS
  Administered 2020-09-01: 30 mg via INTRAVENOUS

## 2020-09-01 MED ORDER — LACTATED RINGERS IV SOLN
INTRAVENOUS | Status: DC
  Administered 2020-09-01: 1000 mL via INTRAVENOUS

## 2020-09-01 MED ORDER — PROPOFOL 500 MG/50ML IV EMUL
INTRAVENOUS | Status: DC | PRN
  Administered 2020-09-01: 150 ug/kg/min via INTRAVENOUS

## 2020-09-01 MED ORDER — STERILE WATER FOR IRRIGATION IR SOLN
Status: DC | PRN
  Administered 2020-09-01: 1.5 mL

## 2020-09-01 NOTE — Transfer of Care (Signed)
Immediate Anesthesia Transfer of Care Note  Patient: Sue Nguyen  Procedure(s) Performed: COLONOSCOPY WITH PROPOFOL (N/A )  Patient Location: PACU  Anesthesia Type:General  Level of Consciousness: awake, alert  and oriented  Airway & Oxygen Therapy: Patient Spontanous Breathing  Post-op Assessment: Report given to RN and Post -op Vital signs reviewed and stable  Post vital signs: Reviewed and stable  Last Vitals:  Vitals Value Taken Time  BP    Temp    Pulse    Resp    SpO2      Last Pain:  Vitals:   09/01/20 0759  TempSrc:   PainSc: 0-No pain      Patients Stated Pain Goal: 8 (25/05/39 7673)  Complications: No complications documented.

## 2020-09-01 NOTE — Anesthesia Postprocedure Evaluation (Signed)
Anesthesia Post Note  Patient: Sue Nguyen  Procedure(s) Performed: COLONOSCOPY WITH PROPOFOL (N/A )  Patient location during evaluation: Phase II Anesthesia Type: General Level of consciousness: awake Pain management: pain level controlled Vital Signs Assessment: post-procedure vital signs reviewed and stable Respiratory status: spontaneous breathing and respiratory function stable Cardiovascular status: blood pressure returned to baseline and stable Postop Assessment: no headache and no apparent nausea or vomiting Anesthetic complications: no Comments: Late entry   No complications documented.   Last Vitals:  Vitals:   09/01/20 0656 09/01/20 0817  BP: 115/77 109/67  Pulse: 70   Resp: 12 14  Temp: 36.7 C 36.4 C  SpO2: 100% 99%    Last Pain:  Vitals:   09/01/20 0817  TempSrc: Oral  PainSc: 0-No pain                 Louann Sjogren

## 2020-09-01 NOTE — Op Note (Signed)
[facility] Patient Name: Sue Nguyen Procedure Date: 09/01/2020 7:54 AM MRN: [id number] Date of Birth: 07/18/1980 Attending MD: Elon Alas. Abbey Chatters DO CSN: [id number] Age: 40 Admit Type: Outpatient Procedure:                Colonoscopy Indications:              Abdominal pain in the right lower quadrant, Chronic                            diarrhea, History of IC valve ulcer Providers:                Elon Alas. Abbey Chatters, DO, Charlsie Quest. Theda Sers RN, RN,                            Aram Candela Referring MD:              Medicines:                See the Anesthesia note for documentation of the                            administered medications Complications:            No immediate complications. Estimated Blood Loss:     Estimated blood loss: none. Procedure:                Pre-Anesthesia Assessment:                           - The anesthesia plan was to use monitored                            anesthesia care (MAC).                           After obtaining informed consent, the colonoscope                            was passed under direct vision. Throughout the                            procedure, the patient's blood pressure, pulse, and                            oxygen saturations were monitored continuously. The                            [id number]-[id number] ([id number]) scope was introduced through                            the anus and advanced to the the cecum, identified                            by appendiceal orifice and ileocecal valve. The                            colonoscopy was performed  without difficulty. The                            patient tolerated the procedure well. The quality                            of the bowel preparation was evaluated using the                            BBPS [facility] Bowel Preparation Scale) with scores                            of: Right Colon = 2 (minor amount of residual                            staining, small fragments of stool and/or  opaque                            liquid, but mucosa seen well), Transverse Colon = 3                            (entire mucosa seen well with no residual staining,                            small fragments of stool or opaque liquid) and Left                            Colon = 3 (entire mucosa seen well with no residual                            staining, small fragments of stool or opaque                            liquid). The total BBPS score equals 8. The quality                            of the bowel preparation was good. Scope In: 8:02:20 AM Scope Out: 8:14:41 AM Scope Withdrawal Time: 0 hours 6 minutes 48 seconds  Total Procedure Duration: 0 hours 12 minutes 21 seconds  Findings:      The perianal and digital rectal examinations were normal.      Non-bleeding internal hemorrhoids were found during endoscopy.      The exam was otherwise without abnormality. IC valve ulcer has healed. Impression:               - Non-bleeding internal hemorrhoids.                           - The examination was otherwise normal.                           - No specimens collected. Moderate Sedation:      Per Anesthesia Care Recommendation:           - Patient has a  contact number available for                            emergencies. The signs and symptoms of potential                            delayed complications were discussed with the                            patient. Return to normal activities tomorrow.                            Written discharge instructions were provided to the                            patient.                           - Resume previous diet.                           - Continue present medications.                           - Await pathology results.                           - Repeat colonoscopy in 10 years for screening                            purposes.                           - Return to GI clinic PRN. Procedure Code(s):        --- Professional ---                            [phone], Colonoscopy, flexible; diagnostic, including                            collection of specimen(s) by brushing or washing,                            when performed (separate procedure) Diagnosis Code(s):        --- Professional ---                           K64.8, Other hemorrhoids                           R10.31, Right lower quadrant pain                           K52.9, Noninfective gastroenteritis and colitis,                            unspecified CPT copyright 2019 American Medical Association. All rights reserved. The  codes documented in this report are preliminary and upon coder review may  be revised to meet current compliance requirements. Elon Alas. Abbey Chatters, DO [name], DO 09/01/2020 8:20:27 AM This report has been signed electronically. Number of Addenda: 0

## 2020-09-01 NOTE — H&P (Signed)
Primary Care Physician:  Ginger Organ Primary Gastroenterologist:  Dr. Abbey Chatters  Pre-Procedure History & Physical: HPI:  Sue Nguyen is a 40 y.o. female is here for a colonoscopy to be performed for RLQ abdominal pain, diarrhea, IC valve ulcer  She had previously seen for multiple GI complaints. EGD showed gastritis with biopsies positive for H. pylori. She was treated with antibiotic therapy and notes vast improvement in her symptoms.  Subsequent eradication testing was negative   Colonoscopy revealed a large ulcer on her ileocecal valve. Biopsies consent with NSAID induced ulcer. Patient states she has been avoiding all NSAIDs for the most part besides an Excedrin once a month for migraines since that time and again notes improvement in her symptoms.  Past Medical History:  Diagnosis Date  . Breast nodule 10/19/2013   Will get [place]  . Contraceptive management 10/19/2013  . Mental disorder    moody  . Obesity   . UTI (lower urinary tract infection) 07/05/2014  . Vaginal Pap smear, abnormal     Past Surgical History:  Procedure Laterality Date  . BIOPSY  03/03/2020   Procedure: BIOPSY;  Surgeon: Eloise Harman, DO;  [facility];  Service: Endoscopy;;  . CERVICAL BIOPSY    . CHOLECYSTECTOMY  04/2013  . COLONOSCOPY WITH PROPOFOL N/A 03/03/2020   Procedure: COLONOSCOPY WITH PROPOFOL;  Surgeon: Eloise Harman, DO;  [facility];  Service: Endoscopy;  Laterality: N/A;  9:45am  . ESOPHAGOGASTRODUODENOSCOPY (EGD) WITH PROPOFOL N/A 03/03/2020   Procedure: ESOPHAGOGASTRODUODENOSCOPY (EGD) WITH PROPOFOL;  Surgeon: Eloise Harman, DO;  [facility];  Service: Endoscopy;  Laterality: N/A;    Prior to Admission medications   Medication Sig Start Date End Date Taking? Authorizing Provider  aspirin-acetaminophen-caffeine (EXCEDRIN MIGRAINE) [id number] MG tablet Take 1 tablet by mouth daily as needed for headache.   Yes [provider]   glucosamine-chondroitin 500-400 MG tablet Take 1 tablet by mouth in the morning and at bedtime.    Yes [provider]  Multiple Vitamins-Minerals (HAIR SKIN AND NAILS FORMULA) TABS Take 1 tablet by mouth daily.   Yes [provider]  omega-3 acid ethyl esters (LOVAZA) 1 g capsule Take 1 g by mouth daily.   Yes [provider]  bismuth-metronidazole-tetracycline (PYLERA) [phone] MG capsule Take 3 capsules by mouth 4 (four) times daily -  before meals and at bedtime. 03/24/20   Eloise Harman, DO  lansoprazole (PREVACID) 30 MG capsule 30 mg bid x 14 days 03/21/20   Eloise Harman, DO  pantoprazole (PROTONIX) 40 MG tablet Take 1 tablet (40 mg total) by mouth 2 (two) times daily. 03/03/20 08/30/20  Eloise Harman, DO    Allergies as of 07/26/2020  . (No Known Allergies)    Family History  Problem Relation Age of Onset  . Cancer Maternal Grandfather        lung  . Alcohol abuse Maternal Grandfather   . Alcohol abuse Maternal Grandmother        recovering alcoholic  . Dementia Maternal Grandmother   . Hypertension Maternal Grandmother   . Diabetes Paternal Grandfather   . Cancer Paternal Grandfather   . Hypertension Paternal Grandmother   . Hyperlipidemia Paternal Grandmother   . Thyroid disease Mother   . Alcohol abuse Other        mom's side of family  . Diabetes Maternal Uncle   . Hypertension Maternal Uncle   . Other Maternal Uncle  spinal problems in wheel chair    Social History   Socioeconomic History  . Marital status: Not on file    Spouse name: Not on file  . Number of children: Not on file  . Years of education: Not on file  . Highest education level: Not on file  Occupational History  . Not on file  Tobacco Use  . Smoking status: Former Smoker    Types: Cigarettes    Quit date: 10/17/2014    Years since quitting: 5.8  . Smokeless tobacco: Never Used  Vaping Use  . Vaping Use: Never used  Substance and Sexual  Activity  . Alcohol use: No  . Drug use: No  . Sexual activity: Yes    Birth control/protection: None  Other Topics Concern  . Not on file  Social History Narrative  . Not on file   Social Determinants of Health   Financial Resource Strain: Not on file  Food Insecurity: Not on file  Transportation Needs: Not on file  Physical Activity: Not on file  Stress: Not on file  Social Connections: Not on file  Intimate Partner Violence: Not on file    Review of Systems: See HPI, otherwise negative ROS  Physical Exam: Vital signs in last 24 hours: Temp:  [98 F (36.7 C)] 98 F (36.7 C) (04/22 0656) Pulse Rate:  [70] 70 (04/22 0656) Resp:  [12] 12 (04/22 0656) BP: (115)/(77) 115/77 (04/22 0656) SpO2:  [100 %] 100 % (04/22 0656) Weight:  [109.8 kg] 109.8 kg (04/22 0656)   General:   Alert,  Well-developed, well-nourished, pleasant and cooperative in NAD Head:  Normocephalic and atraumatic. Eyes:  Sclera clear, no icterus.   Conjunctiva pink. Ears:  Normal auditory acuity. Nose:  No deformity, discharge,  or lesions. Mouth:  No deformity or lesions, dentition normal. Neck:  Supple; no masses or thyromegaly. Lungs:  Clear throughout to auscultation.   No wheezes, crackles, or rhonchi. No acute distress. Heart:  Regular rate and rhythm; no murmurs, clicks, rubs,  or gallops. Abdomen:  Soft, nontender and nondistended. No masses, hepatosplenomegaly or hernias noted. Normal bowel sounds, without guarding, and without rebound.   Msk:  Symmetrical without gross deformities. Normal posture. Extremities:  Without clubbing or edema. Neurologic:  Alert and  oriented x4;  grossly normal neurologically. Skin:  Intact without significant lesions or rashes. Cervical Nodes:  No significant cervical adenopathy. Psych:  Alert and cooperative. Normal mood and affect.  Impression/Plan: [name] is here for a colonoscopy to be performed for RLQ abdominal pain, diarrhea, IC valve ulcer  The  risks of the procedure including infection, bleed, or perforation as well as benefits, limitations, alternatives and imponderables have been reviewed with the patient. Questions have been answered. All parties agreeable.

## 2020-09-01 NOTE — Discharge Instructions (Addendum)
  Colonoscopy Discharge Instructions  Read the instructions outlined below and refer to this sheet in the next few weeks. These discharge instructions provide you with general information on caring for yourself after you leave the hospital. Your doctor may also give you specific instructions. While your treatment has been planned according to the most current medical practices available, unavoidable complications occasionally occur.   ACTIVITY  You may resume your regular activity, but move at a slower pace for the next 24 hours.   Take frequent rest periods for the next 24 hours.   Walking will help get rid of the air and reduce the bloated feeling in your belly (abdomen).   No driving for 24 hours (because of the medicine (anesthesia) used during the test).    Do not sign any important legal documents or operate any machinery for 24 hours (because of the anesthesia used during the test).  NUTRITION  Drink plenty of fluids.   You may resume your normal diet as instructed by your doctor.   Begin with a light meal and progress to your normal diet. Heavy or fried foods are harder to digest and may make you feel sick to your stomach (nauseated).   Avoid alcoholic beverages for 24 hours or as instructed.  MEDICATIONS  You may resume your normal medications unless your doctor tells you otherwise.  WHAT YOU CAN EXPECT TODAY  Some feelings of bloating in the abdomen.   Passage of more gas than usual.   Spotting of blood in your stool or on the toilet paper.  IF YOU HAD POLYPS REMOVED DURING THE COLONOSCOPY:  No aspirin products for 7 days or as instructed.   No alcohol for 7 days or as instructed.   Eat a soft diet for the next 24 hours.  FINDING OUT THE RESULTS OF YOUR TEST Not all test results are available during your visit. If your test results are not back during the visit, make an appointment with your caregiver to find out the results. Do not assume everything is normal if  you have not heard from your caregiver or the medical facility. It is important for you to follow up on all of your test results.  SEEK IMMEDIATE MEDICAL ATTENTION IF:  You have more than a spotting of blood in your stool.   Your belly is swollen (abdominal distention).   You are nauseated or vomiting.   You have a temperature over 101.   You have abdominal pain or discomfort that is severe or gets worse throughout the day.   Colonoscopy was unremarkable.  The previously noted ulcer has completely healed. Recommend repeating in 10 years for screening purposes. Otherwise follow up as needed.   I hope you have a great rest of your week!  Elon Alas. Abbey Chatters, D.O. Gastroenterology and Hepatology [facility]

## 2020-09-01 NOTE — Anesthesia Preprocedure Evaluation (Signed)
Anesthesia Evaluation  Patient identified by MRN, date of birth, ID band Patient awake    Reviewed: Allergy & Precautions, H&P , NPO status , Patient's Chart, lab work & pertinent test results, reviewed documented beta blocker date and time   Airway Mallampati: II  TM Distance: >3 FB Neck ROM: full    Dental no notable dental hx.    Pulmonary neg pulmonary ROS, former smoker,    Pulmonary exam normal breath sounds clear to auscultation       Cardiovascular Exercise Tolerance: Good negative cardio ROS   Rhythm:regular Rate:Normal     Neuro/Psych PSYCHIATRIC DISORDERS Depression  Neuromuscular disease    GI/Hepatic Neg liver ROS, PUD,   Endo/Other  negative endocrine ROS  Renal/GU negative Renal ROS  negative genitourinary   Musculoskeletal   Abdominal   Peds  Hematology negative hematology ROS (+)   Anesthesia Other Findings   Reproductive/Obstetrics negative OB ROS                             Anesthesia Physical Anesthesia Plan  ASA: II  Anesthesia Plan: General   Post-op Pain Management:    Induction:   PONV Risk Score and Plan: Propofol infusion  Airway Management Planned:   Additional Equipment:   Intra-op Plan:   Post-operative Plan:   Informed Consent: I have reviewed the patients History and Physical, chart, labs and discussed the procedure including the risks, benefits and alternatives for the proposed anesthesia with the patient or authorized representative who has indicated his/her understanding and acceptance.     Dental Advisory Given  Plan Discussed with: CRNA  Anesthesia Plan Comments:         Anesthesia Quick Evaluation

## 2020-09-06 ENCOUNTER — Encounter (HOSPITAL_COMMUNITY): Payer: Self-pay | Admitting: Internal Medicine
